# Patient Record
Sex: Female | Born: 1990 | Race: Black or African American | Hispanic: No | Smoking: Former smoker
Health system: Southern US, Community
[De-identification: ages and names within clinical notes are randomized; demographics above are authoritative.]

## PROBLEM LIST (undated history)

## (undated) ENCOUNTER — Inpatient Hospital Stay (HOSPITAL_COMMUNITY): Payer: Self-pay

## (undated) ENCOUNTER — Emergency Department (HOSPITAL_COMMUNITY): Payer: Medicaid Other | Source: Home / Self Care

## (undated) ENCOUNTER — Inpatient Hospital Stay (HOSPITAL_COMMUNITY): Admission: AD | Payer: Medicaid Other

## (undated) DIAGNOSIS — F32A Depression, unspecified: Secondary | ICD-10-CM

## (undated) DIAGNOSIS — A609 Anogenital herpesviral infection, unspecified: Secondary | ICD-10-CM

## (undated) DIAGNOSIS — F329 Major depressive disorder, single episode, unspecified: Secondary | ICD-10-CM

## (undated) DIAGNOSIS — R51 Headache: Secondary | ICD-10-CM

## (undated) DIAGNOSIS — F419 Anxiety disorder, unspecified: Secondary | ICD-10-CM

## (undated) DIAGNOSIS — R87629 Unspecified abnormal cytological findings in specimens from vagina: Secondary | ICD-10-CM

## (undated) DIAGNOSIS — R519 Headache, unspecified: Secondary | ICD-10-CM

## (undated) DIAGNOSIS — R011 Cardiac murmur, unspecified: Secondary | ICD-10-CM

## (undated) DIAGNOSIS — N390 Urinary tract infection, site not specified: Secondary | ICD-10-CM

## (undated) DIAGNOSIS — R002 Palpitations: Secondary | ICD-10-CM

## (undated) DIAGNOSIS — A630 Anogenital (venereal) warts: Secondary | ICD-10-CM

## (undated) HISTORY — DX: Anogenital (venereal) warts: A63.0

## (undated) HISTORY — DX: Anogenital herpesviral infection, unspecified: A60.9

## (undated) HISTORY — PX: WISDOM TOOTH EXTRACTION: SHX21

## (undated) HISTORY — PX: LEEP: SHX91

## (undated) HISTORY — PX: OTHER SURGICAL HISTORY: SHX169

---
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
## 2011-01-12 ENCOUNTER — Ambulatory Visit (HOSPITAL_COMMUNITY)
Admission: RE | Admit: 2011-01-12 | Discharge: 2011-01-12 | Disposition: A | Discharge status: Home / Self Care | Payer: Medicaid Other | Source: Ambulatory Visit | Attending: Obstetrics & Gynecology | Admitting: Obstetrics & Gynecology

## 2011-01-12 ENCOUNTER — Ambulatory Visit (HOSPITAL_COMMUNITY): Payer: Medicaid Other

## 2011-01-12 DIAGNOSIS — O350XX0 Maternal care for (suspected) central nervous system malformation in fetus, not applicable or unspecified: Secondary | ICD-10-CM | POA: Insufficient documentation

## 2011-01-12 DIAGNOSIS — O3500X0 Maternal care for (suspected) central nervous system malformation or damage in fetus, unspecified, not applicable or unspecified: Secondary | ICD-10-CM | POA: Insufficient documentation

## 2011-01-12 DIAGNOSIS — O0990 Supervision of high risk pregnancy, unspecified, unspecified trimester: Secondary | ICD-10-CM

## 2011-01-13 ENCOUNTER — Other Ambulatory Visit: Payer: Self-pay | Admitting: Obstetrics & Gynecology

## 2011-01-13 DIAGNOSIS — G930 Cerebral cysts: Secondary | ICD-10-CM

## 2011-01-13 DIAGNOSIS — O2690 Pregnancy related conditions, unspecified, unspecified trimester: Secondary | ICD-10-CM

## 2011-01-14 ENCOUNTER — Ambulatory Visit (HOSPITAL_COMMUNITY)
Admission: RE | Admit: 2011-01-14 | Discharge: 2011-01-14 | Disposition: A | Discharge status: Home / Self Care | Payer: Medicaid Other | Source: Ambulatory Visit | Attending: Obstetrics & Gynecology | Admitting: Obstetrics & Gynecology

## 2011-01-14 DIAGNOSIS — G930 Cerebral cysts: Secondary | ICD-10-CM

## 2011-01-14 DIAGNOSIS — O2690 Pregnancy related conditions, unspecified, unspecified trimester: Secondary | ICD-10-CM

## 2011-01-14 DIAGNOSIS — O350XX0 Maternal care for (suspected) central nervous system malformation in fetus, not applicable or unspecified: Secondary | ICD-10-CM | POA: Insufficient documentation

## 2011-01-14 DIAGNOSIS — O3500X0 Maternal care for (suspected) central nervous system malformation or damage in fetus, unspecified, not applicable or unspecified: Secondary | ICD-10-CM | POA: Insufficient documentation

## 2011-01-14 DIAGNOSIS — O358XX0 Maternal care for other (suspected) fetal abnormality and damage, not applicable or unspecified: Secondary | ICD-10-CM | POA: Insufficient documentation

## 2011-01-14 NOTE — Progress Notes (Signed)
Patient seen for ultrasound only appointment today.  Please see [facility] report for details.  

## 2011-01-15 ENCOUNTER — Encounter (HOSPITAL_COMMUNITY): Payer: Self-pay

## 2011-01-29 ENCOUNTER — Encounter: Payer: Self-pay | Admitting: Pediatrics

## 2011-02-03 ENCOUNTER — Inpatient Hospital Stay (HOSPITAL_COMMUNITY)
Admission: AD | Admit: 2011-02-03 | Discharge: 2011-02-03 | Disposition: A | Discharge status: Home / Self Care | Payer: Medicaid Other | Source: Ambulatory Visit | Attending: Obstetrics | Admitting: Obstetrics

## 2011-02-03 ENCOUNTER — Encounter (HOSPITAL_COMMUNITY): Payer: Self-pay | Admitting: *Deleted

## 2011-02-03 DIAGNOSIS — O99891 Other specified diseases and conditions complicating pregnancy: Secondary | ICD-10-CM | POA: Insufficient documentation

## 2011-02-03 DIAGNOSIS — N949 Unspecified condition associated with female genital organs and menstrual cycle: Secondary | ICD-10-CM | POA: Insufficient documentation

## 2011-02-03 HISTORY — DX: Palpitations: R00.2

## 2011-02-03 LAB — URINE MICROSCOPIC-ADD ON

## 2011-02-03 LAB — URINALYSIS, ROUTINE W REFLEX MICROSCOPIC
Glucose, UA: NEGATIVE mg/dL
Hgb urine dipstick: NEGATIVE
Ketones, ur: NEGATIVE mg/dL
Urobilinogen, UA: 0.2 mg/dL (ref 0.0–1.0)
pH: 6 (ref 5.0–8.0)

## 2011-02-03 NOTE — Progress Notes (Signed)
Pt in c/o pelvic pain, worse with movement on left side.  States pains are sharp and shooting.  Reports occasional sharp pains that radiate down left leg.  Denies any bleeding, discharge, or leaking of fluid.  + FM.

## 2011-03-11 ENCOUNTER — Encounter (HOSPITAL_COMMUNITY): Payer: Self-pay | Admitting: *Deleted

## 2011-03-11 ENCOUNTER — Inpatient Hospital Stay (HOSPITAL_COMMUNITY)
Admission: AD | Admit: 2011-03-11 | Discharge: 2011-03-11 | Disposition: A | Discharge status: Home / Self Care | Payer: Medicaid Other | Source: Ambulatory Visit | Attending: Obstetrics & Gynecology | Admitting: Obstetrics & Gynecology

## 2011-03-11 DIAGNOSIS — O4700 False labor before 37 completed weeks of gestation, unspecified trimester: Secondary | ICD-10-CM | POA: Insufficient documentation

## 2011-03-11 LAB — URINALYSIS, ROUTINE W REFLEX MICROSCOPIC
Hgb urine dipstick: NEGATIVE
Ketones, ur: NEGATIVE mg/dL
Protein, ur: NEGATIVE mg/dL
Urobilinogen, UA: 0.2 mg/dL (ref 0.0–1.0)

## 2011-03-11 LAB — URINE MICROSCOPIC-ADD ON

## 2011-03-11 NOTE — ED Provider Notes (Signed)
History     Chief Complaint  Patient presents with  . Abdominal Pain   HPI Krista Meza 20 y.o.  35w 6d gestation.  Had difficulty sleeping last night.  Is feeling some pressure like having a bowel movement.  Had one BM today.  Also having upper midline abdominal pain.  Has periodic pain which comes and goes.   OB History    Grav Para Term Preterm Abortions TAB SAB Ect Mult Living   1               Past Medical History  Diagnosis Date  . Heart palpitations     Past Surgical History  Procedure Date  . No past surgeries     No family history on file.  History  Substance Use Topics  . Smoking status: Never Smoker   . Smokeless tobacco: Never Used  . Alcohol Use: No    Allergies:  Allergies  Allergen Reactions  . Amoxicillin Hives    Prescriptions prior to admission  Medication Sig Dispense Refill  . acetaminophen (TYLENOL) 500 MG tablet Take 1,000 mg by mouth every 6 (six) hours as needed. Patient takes for pain       . prenatal vitamin w/FE, FA (PRENATAL 1 + 1) 27-1 MG TABS Take 1 tablet by mouth daily.          Review of Systems  Gastrointestinal: Positive for abdominal pain.  Genitourinary:       Pressure like needing to have a BM   Physical Exam   Blood pressure 131/76, pulse 105, temperature 98.5 F (36.9 C), temperature source Oral, resp. rate 20, height 5\' 1"  (1.549 m), weight 190 lb 12.8 oz (86.546 kg), SpO2 99.00%.  Physical Exam  Nursing note and vitals reviewed. Constitutional: She is oriented to person, place, and time. She appears well-developed and well-nourished.  HENT:  Head: Normocephalic.  Eyes: EOM are normal.  Neck: Neck supple.  GI: Soft. There is no tenderness. There is no rebound and no guarding.       One contraction only on monitor strip. FHT reactive. Baby moving well.  Genitourinary:       Bimanual exam: Cervix 1cm,. 50%, VTX -2 Uterus gravid Adnexa non tender, no masses bilaterally Chaperone present for exam.    Musculoskeletal: Normal range of motion.  Neurological: She is alert and oriented to person, place, and time.  Skin: Skin is warm and dry.  Psychiatric: She has a normal mood and affect.  Client grimaces with fetal movement.  MAU Course  Procedures  MDM Results for orders placed during the hospital encounter of 03/11/11 (from the past 24 hour(s))  URINALYSIS, ROUTINE W REFLEX MICROSCOPIC     Status: Abnormal   Collection Time   03/11/11 10:30 AM      Component Value Range   Color, Urine YELLOW  YELLOW    Appearance CLEAR  CLEAR    Specific Gravity, Urine 1.020  1.005 - 1.030    pH 6.5  5.0 - 8.0    Glucose, UA NEGATIVE  NEGATIVE (mg/dL)   Hgb urine dipstick NEGATIVE  NEGATIVE    Bilirubin Urine NEGATIVE  NEGATIVE    Ketones, ur NEGATIVE  NEGATIVE (mg/dL)   Protein, ur NEGATIVE  NEGATIVE (mg/dL)   Urobilinogen, UA 0.2  0.0 - 1.0 (mg/dL)   Nitrite NEGATIVE  NEGATIVE    Leukocytes, UA SMALL (*) NEGATIVE   URINE MICROSCOPIC-ADD ON     Status: Abnormal   Collection Time   03/11/11  10:30 AM      Component Value Range   Squamous Epithelial / LPF MANY (*) RARE    WBC, UA 3-6  <3 (WBC/hpf)     Assessment and Plan  No UTI False labor  Plan: Keep your appointment in the office tomorrow. Call your doctor if your pain worsens or if you have questions. OK to continue to go to school.  Kindal Ponti 03/11/2011, 12:21 PM   Nolene Bernheim, NP 03/11/11 1307  Nolene Bernheim, NP 03/11/11 1308

## 2011-03-11 NOTE — Progress Notes (Signed)
C/O vague pains in abd. States has had some difficulty having BM. Did have small bowel movement this AM.

## 2011-03-11 NOTE — Progress Notes (Signed)
Pt states that last night she started having tightening around the umbilical area with the need to have a BM. Was unable to sleep and the tightening continues this am. No bleeding or leaking and the baby has been moving well per patient.

## 2011-03-26 ENCOUNTER — Inpatient Hospital Stay (HOSPITAL_COMMUNITY)
Admission: AD | Admit: 2011-03-26 | Discharge: 2011-03-26 | Disposition: A | Discharge status: Home / Self Care | Payer: Medicaid Other | Source: Ambulatory Visit | Attending: Obstetrics & Gynecology | Admitting: Obstetrics & Gynecology

## 2011-03-26 ENCOUNTER — Encounter (HOSPITAL_COMMUNITY): Payer: Self-pay | Admitting: *Deleted

## 2011-03-26 DIAGNOSIS — O10919 Unspecified pre-existing hypertension complicating pregnancy, unspecified trimester: Secondary | ICD-10-CM

## 2011-03-26 DIAGNOSIS — O99891 Other specified diseases and conditions complicating pregnancy: Secondary | ICD-10-CM | POA: Insufficient documentation

## 2011-03-26 DIAGNOSIS — R030 Elevated blood-pressure reading, without diagnosis of hypertension: Secondary | ICD-10-CM | POA: Insufficient documentation

## 2011-03-26 DIAGNOSIS — R51 Headache: Secondary | ICD-10-CM | POA: Insufficient documentation

## 2011-03-26 HISTORY — DX: Urinary tract infection, site not specified: N39.0

## 2011-03-26 HISTORY — DX: Cardiac murmur, unspecified: R01.1

## 2011-03-26 LAB — COMPREHENSIVE METABOLIC PANEL
BUN: 5 mg/dL — ABNORMAL LOW (ref 6–23)
CO2: 24 mEq/L (ref 19–32)
Calcium: 9.7 mg/dL (ref 8.4–10.5)
Creatinine, Ser: 0.65 mg/dL (ref 0.50–1.10)
GFR calc Af Amer: >90 mL/min (ref >90)
GFR calc non Af Amer: >90 mL/min (ref >90)
Glucose, Bld: 88 mg/dL (ref 70–99)
Total Protein: 6.8 g/dL (ref 6.0–8.3)

## 2011-03-26 LAB — CBC
HCT: 33.9 % — ABNORMAL LOW (ref 36.0–46.0)
Hemoglobin: 10.9 g/dL — ABNORMAL LOW (ref 12.0–15.0)
MCH: 28.8 pg (ref 26.0–34.0)
MCHC: 32.2 g/dL (ref 30.0–36.0)
MCV: 89.7 fL (ref 78.0–100.0)
RBC: 3.78 MIL/uL — ABNORMAL LOW (ref 3.87–5.11)

## 2011-03-26 LAB — URINALYSIS, ROUTINE W REFLEX MICROSCOPIC
Glucose, UA: NEGATIVE mg/dL
Ketones, ur: NEGATIVE mg/dL
Protein, ur: NEGATIVE mg/dL
Urobilinogen, UA: 0.2 mg/dL (ref 0.0–1.0)

## 2011-03-26 LAB — URINE MICROSCOPIC-ADD ON

## 2011-03-26 LAB — URIC ACID: Uric Acid, Serum: 3.7 mg/dL (ref 2.4–7.0)

## 2011-03-26 MED ORDER — ACETAMINOPHEN 500 MG PO TABS
1000.0000 mg | ORAL_TABLET | Freq: Once | ORAL | Status: AC
  Administered 2011-03-26: 1000 mg via ORAL
  Filled 2011-03-26: qty 2

## 2011-03-26 NOTE — ED Provider Notes (Signed)
History   Pt presents today for preeclamptic workup. She was seen earlier in the office by Dr. Tamela Oddi with slightly elevated BP and HA. She states she also has feelings of "hot flashes" and occ blurry vision. She denies RUQ pain and reports GFM.  Chief Complaint  Patient presents with  . Hypertension   HPI  OB History    Grav Para Term Preterm Abortions TAB SAB Ect Mult Living   1               Past Medical History  Diagnosis Date  . Heart palpitations   . Heart murmur     palpitations  . Asthma     as child, inhaler for a year  . Urinary tract infection     Past Surgical History  Procedure Date  . No past surgeries     No family history on file.  History  Substance Use Topics  . Smoking status: Never Smoker   . Smokeless tobacco: Never Used  . Alcohol Use: No    Allergies:  Allergies  Allergen Reactions  . Amoxicillin Hives  . Penicillins Hives    Pt is unsure if she is allergic to penicillin or amoxicillin    Prescriptions prior to admission  Medication Sig Dispense Refill  . acetaminophen (TYLENOL) 500 MG tablet Take 1,000 mg by mouth every 6 (six) hours as needed. Patient takes for pain       . prenatal vitamin w/FE, FA (PRENATAL 1 + 1) 27-1 MG TABS Take 1 tablet by mouth daily.          Review of Systems  Constitutional: Negative for fever.  Eyes: Positive for blurred vision. Negative for double vision, photophobia, pain and discharge.  Cardiovascular: Negative for chest pain and palpitations.  Gastrointestinal: Negative for nausea, vomiting, abdominal pain, diarrhea and constipation.  Genitourinary: Negative for dysuria, urgency, frequency and hematuria.  Neurological: Positive for dizziness and headaches.  Psychiatric/Behavioral: Negative for depression and suicidal ideas.   Physical Exam   Blood pressure 106/74, pulse 72, temperature 98.7 F (37.1 C), temperature source Oral, resp. rate 20, height 5\' 1"  (1.549 m), weight 193 lb 3.2 oz  (87.635 kg), SpO2 98.00%.  Physical Exam  Nursing note and vitals reviewed. Constitutional: She is oriented to person, place, and time. She appears well-developed and well-nourished. No distress.  HENT:  Head: Normocephalic and atraumatic.  Eyes: EOM are normal. Pupils are equal, round, and reactive to light.  GI: Soft. She exhibits no distension. There is no tenderness. There is no rebound and no guarding.  Neurological: She is alert and oriented to person, place, and time.  Skin: Skin is warm and dry. She is not diaphoretic.  Psychiatric: She has a normal mood and affect. Her behavior is normal. Judgment and thought content normal.    MAU Course  Procedures  Results for orders placed during the hospital encounter of 03/26/11 (from the past 24 hour(s))  CBC     Status: Abnormal   Collection Time   03/26/11  3:48 PM      Component Value Range   WBC 7.9  4.0 - 10.5 (K/uL)   RBC 3.78 (*) 3.87 - 5.11 (MIL/uL)   Hemoglobin 10.9 (*) 12.0 - 15.0 (g/dL)   HCT 16.1 (*) 09.6 - 46.0 (%)   MCV 89.7  78.0 - 100.0 (fL)   MCH 28.8  26.0 - 34.0 (pg)   MCHC 32.2  30.0 - 36.0 (g/dL)   RDW 04.5  40.9 -  15.5 (%)   Platelets 250  150 - 400 (K/uL)  COMPREHENSIVE METABOLIC PANEL     Status: Abnormal   Collection Time   03/26/11  3:48 PM      Component Value Range   Sodium 133 (*) 135 - 145 (mEq/L)   Potassium 4.3  3.5 - 5.1 (mEq/L)   Chloride 102  96 - 112 (mEq/L)   CO2 24  19 - 32 (mEq/L)   Glucose, Bld 88  70 - 99 (mg/dL)   BUN 5 (*) 6 - 23 (mg/dL)   Creatinine, Ser 4.09  0.50 - 1.10 (mg/dL)   Calcium 9.7  8.4 - 81.1 (mg/dL)   Total Protein 6.8  6.0 - 8.3 (g/dL)   Albumin 3.0 (*) 3.5 - 5.2 (g/dL)   AST 21  0 - 37 (U/L)   ALT 12  0 - 35 (U/L)   Alkaline Phosphatase 143 (*) 39 - 117 (U/L)   Total Bilirubin 0.2 (*) 0.3 - 1.2 (mg/dL)   GFR calc non Af Amer >90  >90 (mL/min)   GFR calc Af Amer >90  >90 (mL/min)  URIC ACID     Status: Normal   Collection Time   03/26/11  3:48 PM       Component Value Range   Uric Acid, Serum 3.7  2.4 - 7.0 (mg/dL)  LACTATE DEHYDROGENASE     Status: Normal   Collection Time   03/26/11  3:48 PM      Component Value Range   LD 172  94 - 250 (U/L)   NST reactive.  Discussed pt with Dr. Tamela Oddi. Ok to dc to home. Assessment and Plan  Preg: no evidence of preeclampsia at this time. BP NL. Will dc to home. She has f/u scheduled with Dr. Tamela Oddi on Monday. Discussed diet, activity, risks, and precautions.  Clinton Gallant. Rice III, DrHSc, MPAS, PA-C  03/26/2011, 4:22 PM   Henrietta Hoover, PA 03/26/11 1656

## 2011-03-26 NOTE — Progress Notes (Signed)
Patient states she has been having nausea, dizziness, headaches and blurred vision and hot flashes. Blood pressure was slightly elevated in the office and was sent to MAU for evaluation. Reports good fetal movement, no bleeding or leaking.

## 2011-03-26 NOTE — Progress Notes (Signed)
Ongoing headache, has not taken anything.  Eyes are hurting.  BP up today (reg appt)

## 2011-04-13 ENCOUNTER — Inpatient Hospital Stay (HOSPITAL_COMMUNITY): Payer: Medicaid Other

## 2011-04-13 ENCOUNTER — Encounter (HOSPITAL_COMMUNITY): Payer: Self-pay | Admitting: *Deleted

## 2011-04-13 ENCOUNTER — Inpatient Hospital Stay (HOSPITAL_COMMUNITY)
Admission: AD | Admit: 2011-04-13 | Discharge: 2011-04-13 | Disposition: A | Discharge status: Home / Self Care | Payer: Medicaid Other | Source: Ambulatory Visit | Attending: Obstetrics & Gynecology | Admitting: Obstetrics & Gynecology

## 2011-04-13 DIAGNOSIS — R109 Unspecified abdominal pain: Secondary | ICD-10-CM | POA: Insufficient documentation

## 2011-04-13 DIAGNOSIS — O368190 Decreased fetal movements, unspecified trimester, not applicable or unspecified: Secondary | ICD-10-CM | POA: Insufficient documentation

## 2011-04-13 LAB — POCT FERN TEST: Fern Test: NEGATIVE

## 2011-04-13 NOTE — Progress Notes (Signed)
Patient states she has felt decrease in fetal movement for about 6 days. Still feels movement, not as much. Has passed her mucus plug and having abdominal and low back pain. Had membranes stripped last week and was 4 cm.

## 2011-04-13 NOTE — Progress Notes (Signed)
Has been leaking clear fluid since Sunday, no active leaking.

## 2011-04-13 NOTE — ED Provider Notes (Signed)
History     CSN: [id number] Arrival date & time: 04/13/2011 11:48 AM   None     Chief Complaint  Patient presents with  . Decreased Fetal Movement  . Abdominal Pain  . Back Pain    HPI Krista Meza is a 20 y.o. female @ [redacted]w[redacted]d who presents to MAU for ? Rupture of membranes. Last office visit 4 cm dilated. Scheduled for induction 04/16/11.   Past Medical History  Diagnosis Date  . Heart palpitations   . Heart murmur     palpitations  . Asthma     as child, inhaler for a year  . Urinary tract infection     Past Surgical History  Procedure Date  . No past surgeries     Family History  Problem Relation Age of Onset  . Anesthesia problems Neg Hx     History  Substance Use Topics  . Smoking status: Never Smoker   . Smokeless tobacco: Never Used  . Alcohol Use: No    OB History    Grav Para Term Preterm Abortions TAB SAB Ect Mult Living   1               Review of Systems  Allergies  Amoxicillin and Penicillins  Home Medications  No current outpatient prescriptions on file.  BP 114/76  Pulse 84  Temp(Src) 98.4 F (36.9 C) (Oral)  Resp 20  Ht 5\' 1"  (1.549 m)  Wt 193 lb 6.4 oz (87.726 kg)  BMI 36.54 kg/m2  SpO2 98%  Physical Exam  Nursing note and vitals reviewed. Constitutional: She is oriented to person, place, and time. She appears well-developed and well-nourished.  HENT:  Head: Normocephalic and atraumatic.  Eyes: EOM are normal.  Neck: Neck supple.  Cardiovascular: Normal rate.   Pulmonary/Chest: Effort normal.  Abdominal: Soft. There is no tenderness.       Gravid c/w dates.  Genitourinary:       External genitalia without lesions. Small amount of white discharge vaginal vault. No pooling noted. Cervix 4 cm. Uterus size consistent with dates.  Musculoskeletal: Normal range of motion.  Neurological: She is alert and oriented to person, place, and time. No cranial nerve deficit.  Skin: Skin is warm and dry.  Psychiatric: She has a  normal mood and affect. Her behavior is normal. Judgment and thought content normal.     Slide collected and examined for ferning. Is fern negative.  Orders from Dr. Marcia Brash office for ultrasound. RN to call results to office.  ED Course  Procedures  MDM          Kerrie Buffalo, NP 04/13/11 1335

## 2011-04-13 NOTE — Progress Notes (Signed)
Pt in c/o leaking of fluid since Sunday, states she lost her mucus plug on Friday and Saturday.  Reports headaches since last Tuesday, has been taking tylenol, does not help.  Reports piercing pains down legs, mainly right, and in lower back.  Denies any bleeding.  Reports decreased fetal movement since Saturday.

## 2011-04-15 ENCOUNTER — Encounter (HOSPITAL_COMMUNITY): Payer: Self-pay | Admitting: Anesthesiology

## 2011-04-15 ENCOUNTER — Inpatient Hospital Stay (HOSPITAL_COMMUNITY)
Admission: AD | Admit: 2011-04-15 | Discharge: 2011-04-19 | DRG: 766 | Disposition: A | Discharge status: Home / Self Care | Payer: Medicaid Other | Source: Ambulatory Visit | Attending: Obstetrics & Gynecology | Admitting: Obstetrics & Gynecology

## 2011-04-15 ENCOUNTER — Other Ambulatory Visit: Payer: Self-pay | Admitting: Obstetrics & Gynecology

## 2011-04-15 ENCOUNTER — Other Ambulatory Visit: Payer: Self-pay | Admitting: Obstetrics and Gynecology

## 2011-04-15 ENCOUNTER — Telehealth (HOSPITAL_COMMUNITY): Payer: Self-pay | Admitting: *Deleted

## 2011-04-15 ENCOUNTER — Inpatient Hospital Stay (HOSPITAL_COMMUNITY): Admission: RE | Admit: 2011-04-15 | Payer: Medicaid Other | Source: Ambulatory Visit

## 2011-04-15 ENCOUNTER — Encounter (HOSPITAL_COMMUNITY): Payer: Self-pay | Admitting: *Deleted

## 2011-04-15 ENCOUNTER — Encounter (HOSPITAL_COMMUNITY): Payer: Self-pay

## 2011-04-15 DIAGNOSIS — O324XX0 Maternal care for high head at term, not applicable or unspecified: Secondary | ICD-10-CM | POA: Diagnosis present

## 2011-04-15 DIAGNOSIS — O480 Post-term pregnancy: Principal | ICD-10-CM | POA: Diagnosis present

## 2011-04-15 LAB — CBC
MCH: 29.7 pg (ref 26.0–34.0)
MCHC: 33.1 g/dL (ref 30.0–36.0)
MCV: 89.6 fL (ref 78.0–100.0)
Platelets: 245 10*3/uL (ref 150–400)
RBC: 3.94 MIL/uL (ref 3.87–5.11)
RDW: 14.5 % (ref 11.5–15.5)

## 2011-04-15 MED ORDER — EPHEDRINE 5 MG/ML INJ
10.0000 mg | INTRAVENOUS | Status: DC | PRN
  Filled 2011-04-15: qty 4

## 2011-04-15 MED ORDER — BUTORPHANOL TARTRATE 2 MG/ML IJ SOLN
1.0000 mg | INTRAMUSCULAR | Status: DC | PRN
  Administered 2011-04-15 (×2): 1 mg via INTRAVENOUS
  Filled 2011-04-15 (×2): qty 1

## 2011-04-15 MED ORDER — OXYTOCIN 20 UNITS IN LACTATED RINGERS INFUSION - SIMPLE: 125.0000 mL/h | Freq: Once | INTRAVENOUS | Status: DC

## 2011-04-15 MED ORDER — FENTANYL 2.5 MCG/ML BUPIVACAINE 1/10 % EPIDURAL INFUSION (WH - ANES)
14.0000 mL/h | INTRAMUSCULAR | Status: DC
  Administered 2011-04-16 (×3): 14 mL/h via EPIDURAL
  Filled 2011-04-15 (×4): qty 60

## 2011-04-15 MED ORDER — EPHEDRINE 5 MG/ML INJ: 10.0000 mg | INTRAVENOUS | Status: DC | PRN

## 2011-04-15 MED ORDER — LACTATED RINGERS IV SOLN
500.0000 mL | Freq: Once | INTRAVENOUS | Status: AC
  Administered 2011-04-16: 500 mL via INTRAVENOUS

## 2011-04-15 MED ORDER — ONDANSETRON HCL 4 MG/2ML IJ SOLN: 4.0000 mg | Freq: Four times a day (QID) | INTRAMUSCULAR | Status: DC | PRN

## 2011-04-15 MED ORDER — LACTATED RINGERS IV SOLN
INTRAVENOUS | Status: DC
  Administered 2011-04-15 – 2011-04-16 (×2): 125 mL/h via INTRAVENOUS

## 2011-04-15 MED ORDER — PHENYLEPHRINE 40 MCG/ML (10ML) SYRINGE FOR IV PUSH (FOR BLOOD PRESSURE SUPPORT): 80.0000 ug | PREFILLED_SYRINGE | INTRAVENOUS | Status: DC | PRN

## 2011-04-15 MED ORDER — IBUPROFEN 600 MG PO TABS: 600.0000 mg | ORAL_TABLET | Freq: Four times a day (QID) | ORAL | Status: DC | PRN

## 2011-04-15 MED ORDER — OXYCODONE-ACETAMINOPHEN 5-325 MG PO TABS: 2.0000 | ORAL_TABLET | ORAL | Status: DC | PRN

## 2011-04-15 MED ORDER — PHENYLEPHRINE 40 MCG/ML (10ML) SYRINGE FOR IV PUSH (FOR BLOOD PRESSURE SUPPORT)
80.0000 ug | PREFILLED_SYRINGE | INTRAVENOUS | Status: DC | PRN
  Filled 2011-04-15: qty 5

## 2011-04-15 MED ORDER — FENTANYL 2.5 MCG/ML BUPIVACAINE 1/10 % EPIDURAL INFUSION (WH - ANES)
INTRAMUSCULAR | Status: DC | PRN
  Administered 2011-04-15: 14 mL/h via EPIDURAL

## 2011-04-15 MED ORDER — LIDOCAINE HCL (PF) 1 % IJ SOLN
30.0000 mL | INTRAMUSCULAR | Status: DC | PRN
  Filled 2011-04-15: qty 30

## 2011-04-15 MED ORDER — DIPHENHYDRAMINE HCL 50 MG/ML IJ SOLN: 12.5000 mg | INTRAMUSCULAR | Status: DC | PRN

## 2011-04-15 MED ORDER — LACTATED RINGERS IV SOLN
500.0000 mL | INTRAVENOUS | Status: DC | PRN
  Administered 2011-04-15 – 2011-04-16 (×2): 1000 mL via INTRAVENOUS

## 2011-04-15 MED ORDER — CITRIC ACID-SODIUM CITRATE 334-500 MG/5ML PO SOLN
30.0000 mL | ORAL | Status: DC | PRN
  Administered 2011-04-16: 30 mL via ORAL
  Filled 2011-04-15: qty 15

## 2011-04-15 MED ORDER — ACETAMINOPHEN 325 MG PO TABS: 650.0000 mg | ORAL_TABLET | ORAL | Status: DC | PRN

## 2011-04-15 MED ORDER — OXYTOCIN BOLUS FROM INFUSION
500.0000 mL | Freq: Once | INTRAVENOUS | Status: DC
  Filled 2011-04-15: qty 1000
  Filled 2011-04-15: qty 500

## 2011-04-15 MED ORDER — LIDOCAINE HCL 1.5 % IJ SOLN
INTRAMUSCULAR | Status: DC | PRN
  Administered 2011-04-15 (×2): 5 mL via EPIDURAL

## 2011-04-15 NOTE — Anesthesia Preprocedure Evaluation (Signed)
Anesthesia Evaluation  Patient identified by MRN, date of birth, ID band Patient awake    Reviewed: Allergy & Precautions, H&P , NPO status , Patient's Chart, lab work & pertinent test results  Airway Mallampati: II TM Distance: >3 FB Neck ROM: full    Dental No notable dental hx.    Pulmonary    Pulmonary exam normal       Cardiovascular neg cardio ROS     Neuro/Psych Negative Neurological ROS  Negative Psych ROS   GI/Hepatic negative GI ROS, Neg liver ROS,   Endo/Other  Morbid obesity  Renal/GU negative Renal ROS  Genitourinary negative   Musculoskeletal negative musculoskeletal ROS (+)   Abdominal (+) obese,   Peds negative pediatric ROS (+)  Hematology negative hematology ROS (+)   Anesthesia Other Findings   Reproductive/Obstetrics (+) Pregnancy                           Anesthesia Physical Anesthesia Plan  ASA: III  Anesthesia Plan: Epidural   Post-op Pain Management:    Induction:   Airway Management Planned:   Additional Equipment:   Intra-op Plan:   Post-operative Plan:   Informed Consent: I have reviewed the patients History and Physical, chart, labs and discussed the procedure including the risks, benefits and alternatives for the proposed anesthesia with the patient or authorized representative who has indicated his/her understanding and acceptance.     Plan Discussed with:   Anesthesia Plan Comments:         Anesthesia Quick Evaluation

## 2011-04-15 NOTE — Telephone Encounter (Signed)
Preadmission screen  

## 2011-04-15 NOTE — H&P (Signed)
Krista Meza is a 20 y.o. G1P0  female presenting for worsening contractions, IUP at 40.6weeks. She was scheduled for pitocin induction this evening. Maternal Medical History:  Reason for admission: Reason for admission: contractions and nausea.  Worsening since office visit today.  Contractions: Onset was 3-5 hours ago.   Frequency: regular.   Perceived severity is moderate.    Fetal activity: Perceived fetal activity is normal.   Last perceived fetal movement was within the past hour.    Prenatal complications: no prenatal complications Postdates pregnancy.  Prenatal Complications - Diabetes: none.    OB History    Grav Para Term Preterm Abortions TAB SAB Ect Mult Living   1              Past Medical History  Diagnosis Date  . Heart palpitations   . Heart murmur     palpitations  . Asthma     as child, inhaler for a year  . Urinary tract infection    Past Surgical History  Procedure Date  . No past surgeries    Family History: family history is negative for Anesthesia problems, and Hypotension, and Malignant hyperthermia, and Pseudochol deficiency, . Social History:  reports that she has never smoked. She has never used smokeless tobacco. She reports that she does not drink alcohol or use illicit drugs.  Review of Systems  Constitutional: Negative for fever and chills.  Respiratory: Negative for cough and shortness of breath.   Cardiovascular: Negative for chest pain and palpitations.  Gastrointestinal: Positive for nausea. Negative for vomiting.  Genitourinary: Negative.   Musculoskeletal: Negative.   Skin: Negative.   Neurological: Negative for dizziness and headaches.    Dilation: 4 Effacement (%): 90 Station: -2 Exam by:: J berrong Blood pressure 119/70, pulse 90, temperature 97.5 F (36.4 C), temperature source Oral, resp. rate 22, height 5\' 1"  (1.549 m), weight 87.544 kg (193 lb). Maternal Exam:  Uterine Assessment: Contraction strength is  moderate.  Contraction frequency is regular.  UCs q2-61mins  Abdomen: Patient reports no abdominal tenderness. Estimated fetal weight is 8lbs.   Fetal presentation: vertex  Introitus: Normal vulva. Normal vagina.  Pelvis: adequate for delivery.   Cervix: Cervix evaluated by digital exam.     Fetal Exam Fetal Monitor Review: Mode: ultrasound.   Baseline rate: 135.  Variability: moderate (6-25 bpm).   Pattern: accelerations present and no decelerations.    Fetal State Assessment: Category I - tracings are normal.     Physical Exam  Constitutional: She is oriented to person, place, and time. She appears well-developed and well-nourished. No distress.  HENT:  Head: Normocephalic and atraumatic.  Eyes: Pupils are equal, round, and reactive to light.  Neck: Normal range of motion.  Cardiovascular: Normal rate, regular rhythm and normal heart sounds.   No murmur heard. Respiratory: Effort normal and breath sounds normal.  GI: Soft. Bowel sounds are normal.  Genitourinary: Vagina normal and uterus normal.  Musculoskeletal: Normal range of motion.  Neurological: She is alert and oriented to person, place, and time. She has normal reflexes.  Skin: Skin is warm and dry.  Psychiatric: She has a normal mood and affect. Her behavior is normal. Judgment and thought content normal.    Prenatal labs: ABO, Rh: O/Positive/-- (05/15 0000) Antibody: Negative (05/15 0000) Rubella: Immune (05/15 0000) RPR: Nonreactive (05/15 0000)  HBsAg: Negative (05/15 0000)  HIV: Non-reactive (05/15 0000)  GBS: Negative (11/14 0000)   Assessment/Plan: Admit to L&D, IVPM and/or epidural prn pain as  requested by pt. Anticipate NSVD, Consult with Dr. Tamela Oddi prn.    [name] CNM 04/15/2011, 7:24 PM

## 2011-04-15 NOTE — Progress Notes (Addendum)
Krista Meza is a 20 y.o. G1P0 at [redacted]w[redacted]d by ultrasound admitted for active labor  Subjective: Pt rec'd stadol x 1, with good relief. Coping well with labor.   Objective: BP 100/57  Pulse 87  Temp(Src) 97.5 F (36.4 C) (Oral)  Resp 22  Ht 5\' 1"  (1.549 m)  Wt 87.544 kg (193 lb)  BMI 36.47 kg/m2      FHT:  FHR: 140 bpm, variability: moderate,  accelerations:  Present,  decelerations:  Absent UC:   regular, every 2-3 minutes, moderate to palpation. SVE:   Dilation: 5.5 Effacement (%): 100 Station: -2 Exam by:: R. Hamby CNM  Labs: Lab Results  Component Value Date   WBC 12.8* 04/15/2011   HGB 11.7* 04/15/2011   HCT 35.3* 04/15/2011   MCV 89.6 04/15/2011   PLT 245 04/15/2011    Assessment / Plan: Spontaneous labor, progressing normally, Consult with Dr. Tamela Oddi prn.   Labor: Progressing normally Preeclampsia:  no signs or symptoms of toxicity Fetal Wellbeing:  Category I Pain Control:  Stadol given x 1, pt declines further intervention at this time.  I/D:  n/a Anticipated MOD:  NSVD  [name] CNM 04/15/2011, 8:27 PM

## 2011-04-15 NOTE — Anesthesia Procedure Notes (Addendum)
Epidural Patient location during procedure: OB Start time: 04/15/2011 10:25 PM End time: 04/15/2011 10:30 PM Reason for block: procedure for pain  Staffing Anesthesiologist: Sandrea Hughs Performed by: anesthesiologist   Preanesthetic Checklist Completed: patient identified, site marked, surgical consent, pre-op evaluation, timeout performed, IV checked, risks and benefits discussed and monitors and equipment checked  Epidural Patient position: sitting Prep: site prepped and draped and DuraPrep Patient monitoring: continuous pulse ox and blood pressure Approach: midline Injection technique: LOR air  Needle:  Needle type: Tuohy  Needle gauge: 17 G Needle length: 9 cm Needle insertion depth: 5 cm cm Catheter type: closed end flexible Catheter size: 19 Gauge Catheter at skin depth: 10 cm Test dose: negative and 1.5% lidocaine  Assessment Sensory level: T8 Events: blood not aspirated, injection not painful, no injection resistance, negative IV test and no paresthesia

## 2011-04-15 NOTE — Progress Notes (Signed)
Krista Meza is a 20 y.o. G1P0 at [redacted]w[redacted]d by ultrasound admitted for active labor  Subjective: Pt with increasing discomfort, and desires epidural.  Objective: BP 105/75  Pulse 95  Temp(Src) 97.5 F (36.4 C) (Oral)  Resp 22  Ht 5\' 1"  (1.549 m)  Wt 87.544 kg (193 lb)  BMI 36.47 kg/m2      FHT:  FHR: 140 bpm, variability: moderate,  accelerations:  Present,  decelerations:  Absent UC:   regular, every 1-3 minutes SVE:   Dilation: 6.5 Effacement (%): 100 Station: -1 Exam by:: R. Hamby CNM  Labs: Lab Results  Component Value Date   WBC 12.8* 04/15/2011   HGB 11.7* 04/15/2011   HCT 35.3* 04/15/2011   MCV 89.6 04/15/2011   PLT 245 04/15/2011    Assessment / Plan: Spontaneous labor, progressing normally, head well applied, AROM large amount of clear fluid. Consult with Dr. Tamela Oddi prn.  Labor: Progressing normally Preeclampsia:  no signs or symptoms of toxicity Fetal Wellbeing:  Category I Pain Control:  labor support, stadol repeated at 2120, epidural now. I/D:  n/a Anticipated MOD:  NSVD  [name] CNM 04/15/2011, 10:24 PM

## 2011-04-16 ENCOUNTER — Inpatient Hospital Stay (HOSPITAL_COMMUNITY): Payer: Medicaid Other | Admitting: Anesthesiology

## 2011-04-16 ENCOUNTER — Encounter (HOSPITAL_COMMUNITY): Payer: Self-pay | Admitting: Anesthesiology

## 2011-04-16 ENCOUNTER — Encounter (HOSPITAL_COMMUNITY): Payer: Self-pay | Admitting: *Deleted

## 2011-04-16 ENCOUNTER — Encounter (HOSPITAL_COMMUNITY)
Admission: AD | Disposition: A | Discharge status: Home / Self Care | Payer: Self-pay | Source: Ambulatory Visit | Attending: Obstetrics & Gynecology

## 2011-04-16 SURGERY — Surgical Case
Anesthesia: Regional | Site: Uterus | Wound class: Clean Contaminated

## 2011-04-16 MED ORDER — TETANUS-DIPHTH-ACELL PERTUSSIS 5-2.5-18.5 LF-MCG/0.5 IM SUSP: 0.5000 mL | Freq: Once | INTRAMUSCULAR | Status: DC

## 2011-04-16 MED ORDER — SCOPOLAMINE 1 MG/3DAYS TD PT72
MEDICATED_PATCH | TRANSDERMAL | Status: AC
  Administered 2011-04-16: 1.5 mg via TRANSDERMAL
  Filled 2011-04-16: qty 1

## 2011-04-16 MED ORDER — NALOXONE HCL 0.4 MG/ML IJ SOLN: 0.4000 mg | INTRAMUSCULAR | Status: DC | PRN

## 2011-04-16 MED ORDER — MEPERIDINE HCL 25 MG/ML IJ SOLN: 6.2500 mg | INTRAMUSCULAR | Status: DC | PRN

## 2011-04-16 MED ORDER — LIDOCAINE-EPINEPHRINE 2 %-1:100000 IJ SOLN
INTRAMUSCULAR | Status: DC | PRN
  Administered 2011-04-16: 4 mL via INTRADERMAL

## 2011-04-16 MED ORDER — MORPHINE SULFATE (PF) 0.5 MG/ML IJ SOLN
INTRAMUSCULAR | Status: DC | PRN
  Administered 2011-04-16: 4 mg via EPIDURAL

## 2011-04-16 MED ORDER — PHENYLEPHRINE HCL 10 MG/ML IJ SOLN
INTRAMUSCULAR | Status: DC | PRN
  Administered 2011-04-16: 80 ug via INTRAVENOUS

## 2011-04-16 MED ORDER — SODIUM BICARBONATE 8.4 % IV SOLN
INTRAVENOUS | Status: DC | PRN
  Administered 2011-04-16: 2 mL via EPIDURAL

## 2011-04-16 MED ORDER — IBUPROFEN 600 MG PO TABS
600.0000 mg | ORAL_TABLET | Freq: Four times a day (QID) | ORAL | Status: DC | PRN
  Administered 2011-04-17: 600 mg via ORAL

## 2011-04-16 MED ORDER — MIDAZOLAM HCL 5 MG/5ML IJ SOLN
INTRAMUSCULAR | Status: DC | PRN
  Administered 2011-04-16 (×2): 1 mg via INTRAVENOUS

## 2011-04-16 MED ORDER — ONDANSETRON HCL 4 MG/2ML IJ SOLN
INTRAMUSCULAR | Status: DC | PRN
  Administered 2011-04-16: 4 mg via INTRAVENOUS

## 2011-04-16 MED ORDER — PRENATAL PLUS 27-1 MG PO TABS
1.0000 | ORAL_TABLET | Freq: Every day | ORAL | Status: DC
  Administered 2011-04-17 – 2011-04-19 (×3): 1 via ORAL
  Filled 2011-04-16 (×3): qty 1

## 2011-04-16 MED ORDER — IBUPROFEN 600 MG PO TABS
600.0000 mg | ORAL_TABLET | Freq: Four times a day (QID) | ORAL | Status: DC
  Administered 2011-04-17 – 2011-04-19 (×9): 600 mg via ORAL
  Filled 2011-04-16 (×10): qty 1

## 2011-04-16 MED ORDER — KETOROLAC TROMETHAMINE 30 MG/ML IJ SOLN: 30.0000 mg | Freq: Four times a day (QID) | INTRAMUSCULAR | Status: AC | PRN

## 2011-04-16 MED ORDER — SODIUM BICARBONATE 8.4 % IV SOLN
INTRAVENOUS | Status: AC
  Filled 2011-04-16: qty 50

## 2011-04-16 MED ORDER — OXYCODONE-ACETAMINOPHEN 5-325 MG PO TABS: 1.0000 | ORAL_TABLET | ORAL | Status: DC | PRN

## 2011-04-16 MED ORDER — LACTATED RINGERS IV SOLN: INTRAVENOUS | Status: DC

## 2011-04-16 MED ORDER — MEDROXYPROGESTERONE ACETATE 150 MG/ML IM SUSP: 150.0000 mg | INTRAMUSCULAR | Status: DC | PRN

## 2011-04-16 MED ORDER — ZOLPIDEM TARTRATE 5 MG PO TABS: 5.0000 mg | ORAL_TABLET | Freq: Every evening | ORAL | Status: DC | PRN

## 2011-04-16 MED ORDER — MEASLES, MUMPS & RUBELLA VAC ~~LOC~~ INJ
0.5000 mL | INJECTION | Freq: Once | SUBCUTANEOUS | Status: DC
  Filled 2011-04-16: qty 0.5

## 2011-04-16 MED ORDER — ONDANSETRON HCL 4 MG/2ML IJ SOLN: 4.0000 mg | Freq: Three times a day (TID) | INTRAMUSCULAR | Status: DC | PRN

## 2011-04-16 MED ORDER — DIBUCAINE 1 % RE OINT: 1.0000 "application " | TOPICAL_OINTMENT | RECTAL | Status: DC | PRN

## 2011-04-16 MED ORDER — SODIUM CHLORIDE 0.9 % IJ SOLN: 3.0000 mL | INTRAMUSCULAR | Status: DC | PRN

## 2011-04-16 MED ORDER — CEFAZOLIN SODIUM 1-5 GM-% IV SOLN
INTRAVENOUS | Status: AC
  Filled 2011-04-16: qty 100

## 2011-04-16 MED ORDER — KETOROLAC TROMETHAMINE 30 MG/ML IJ SOLN
30.0000 mg | Freq: Four times a day (QID) | INTRAMUSCULAR | Status: AC | PRN
  Administered 2011-04-16: 30 mg via INTRAVENOUS
  Filled 2011-04-16: qty 1

## 2011-04-16 MED ORDER — MIDAZOLAM HCL 2 MG/2ML IJ SOLN
INTRAMUSCULAR | Status: AC
  Filled 2011-04-16: qty 2

## 2011-04-16 MED ORDER — MORPHINE SULFATE (PF) 0.5 MG/ML IJ SOLN
INTRAMUSCULAR | Status: DC | PRN
  Administered 2011-04-16: 1 mg via INTRAVENOUS

## 2011-04-16 MED ORDER — WITCH HAZEL-GLYCERIN EX PADS: 1.0000 "application " | MEDICATED_PAD | CUTANEOUS | Status: DC | PRN

## 2011-04-16 MED ORDER — DIPHENHYDRAMINE HCL 25 MG PO CAPS: 25.0000 mg | ORAL_CAPSULE | Freq: Four times a day (QID) | ORAL | Status: DC | PRN

## 2011-04-16 MED ORDER — HYDROMORPHONE HCL PF 1 MG/ML IJ SOLN
0.2500 mg | INTRAMUSCULAR | Status: DC | PRN
  Administered 2011-04-16: 0.5 mg via INTRAVENOUS

## 2011-04-16 MED ORDER — DIPHENHYDRAMINE HCL 50 MG/ML IJ SOLN: 12.5000 mg | INTRAMUSCULAR | Status: DC | PRN

## 2011-04-16 MED ORDER — HYDROMORPHONE HCL PF 1 MG/ML IJ SOLN
INTRAMUSCULAR | Status: AC
  Filled 2011-04-16: qty 1

## 2011-04-16 MED ORDER — LIDOCAINE-EPINEPHRINE (PF) 2 %-1:200000 IJ SOLN
INTRAMUSCULAR | Status: AC
  Filled 2011-04-16: qty 20

## 2011-04-16 MED ORDER — LACTATED RINGERS IV SOLN
INTRAVENOUS | Status: DC | PRN
  Administered 2011-04-16: 10:00:00 via INTRAVENOUS

## 2011-04-16 MED ORDER — OXYTOCIN 20 UNITS IN LACTATED RINGERS INFUSION - SIMPLE: 125.0000 mL/h | INTRAVENOUS | Status: AC

## 2011-04-16 MED ORDER — BUPIVACAINE HCL (PF) 0.25 % IJ SOLN
INTRAMUSCULAR | Status: DC | PRN
  Administered 2011-04-16 (×2): 5 mL
  Administered 2011-04-16: 4 mL

## 2011-04-16 MED ORDER — MEPERIDINE HCL 25 MG/ML IJ SOLN
INTRAMUSCULAR | Status: DC | PRN
  Administered 2011-04-16 (×3): 6 mg via INTRAVENOUS

## 2011-04-16 MED ORDER — FERROUS SULFATE 325 (65 FE) MG PO TABS
325.0000 mg | ORAL_TABLET | Freq: Two times a day (BID) | ORAL | Status: DC
  Administered 2011-04-17 – 2011-04-19 (×5): 325 mg via ORAL
  Filled 2011-04-16 (×5): qty 1

## 2011-04-16 MED ORDER — ONDANSETRON HCL 4 MG PO TABS: 4.0000 mg | ORAL_TABLET | ORAL | Status: DC | PRN

## 2011-04-16 MED ORDER — SODIUM CHLORIDE 0.9 % IV SOLN
1.0000 ug/kg/h | INTRAVENOUS | Status: DC | PRN
  Filled 2011-04-16: qty 2.5

## 2011-04-16 MED ORDER — METOCLOPRAMIDE HCL 5 MG/ML IJ SOLN: 10.0000 mg | Freq: Three times a day (TID) | INTRAMUSCULAR | Status: DC | PRN

## 2011-04-16 MED ORDER — FENTANYL CITRATE 0.05 MG/ML IJ SOLN
INTRAMUSCULAR | Status: DC | PRN
  Administered 2011-04-16 (×2): 50 ug via INTRAVENOUS

## 2011-04-16 MED ORDER — MAGNESIUM HYDROXIDE 400 MG/5ML PO SUSP: 30.0000 mL | ORAL | Status: DC | PRN

## 2011-04-16 MED ORDER — MORPHINE SULFATE 0.5 MG/ML IJ SOLN
INTRAMUSCULAR | Status: AC
  Filled 2011-04-16: qty 10

## 2011-04-16 MED ORDER — ONDANSETRON HCL 4 MG/2ML IJ SOLN
INTRAMUSCULAR | Status: AC
  Filled 2011-04-16: qty 2

## 2011-04-16 MED ORDER — DIPHENHYDRAMINE HCL 25 MG PO CAPS
25.0000 mg | ORAL_CAPSULE | ORAL | Status: DC | PRN
  Administered 2011-04-16: 25 mg via ORAL
  Filled 2011-04-16: qty 1

## 2011-04-16 MED ORDER — KETOROLAC TROMETHAMINE 60 MG/2ML IM SOLN
60.0000 mg | Freq: Once | INTRAMUSCULAR | Status: AC | PRN
  Administered 2011-04-16: 60 mg via INTRAMUSCULAR

## 2011-04-16 MED ORDER — OXYTOCIN 10 UNIT/ML IJ SOLN
INTRAMUSCULAR | Status: AC
  Filled 2011-04-16: qty 2

## 2011-04-16 MED ORDER — FENTANYL CITRATE 0.05 MG/ML IJ SOLN
INTRAMUSCULAR | Status: AC
  Filled 2011-04-16: qty 2

## 2011-04-16 MED ORDER — SENNOSIDES-DOCUSATE SODIUM 8.6-50 MG PO TABS
2.0000 | ORAL_TABLET | Freq: Every day | ORAL | Status: DC
  Administered 2011-04-16 – 2011-04-18 (×3): 2 via ORAL

## 2011-04-16 MED ORDER — LANOLIN HYDROUS EX OINT: 1.0000 "application " | TOPICAL_OINTMENT | CUTANEOUS | Status: DC | PRN

## 2011-04-16 MED ORDER — NALBUPHINE HCL 10 MG/ML IJ SOLN
5.0000 mg | INTRAMUSCULAR | Status: DC | PRN
  Filled 2011-04-16: qty 1

## 2011-04-16 MED ORDER — ONDANSETRON HCL 4 MG/2ML IJ SOLN
4.0000 mg | INTRAMUSCULAR | Status: DC | PRN
  Administered 2011-04-16: 4 mg via INTRAVENOUS
  Filled 2011-04-16: qty 2

## 2011-04-16 MED ORDER — LACTATED RINGERS IV SOLN
INTRAVENOUS | Status: DC | PRN
  Administered 2011-04-16 (×2): via INTRAVENOUS

## 2011-04-16 MED ORDER — KETOROLAC TROMETHAMINE 60 MG/2ML IM SOLN
INTRAMUSCULAR | Status: AC
  Administered 2011-04-16: 60 mg via INTRAMUSCULAR
  Filled 2011-04-16: qty 2

## 2011-04-16 MED ORDER — DIPHENHYDRAMINE HCL 50 MG/ML IJ SOLN: 25.0000 mg | INTRAMUSCULAR | Status: DC | PRN

## 2011-04-16 MED ORDER — SCOPOLAMINE 1 MG/3DAYS TD PT72
1.0000 | MEDICATED_PATCH | Freq: Once | TRANSDERMAL | Status: AC
  Administered 2011-04-16: 1.5 mg via TRANSDERMAL

## 2011-04-16 MED ORDER — SIMETHICONE 80 MG PO CHEW
80.0000 mg | CHEWABLE_TABLET | ORAL | Status: DC | PRN
  Administered 2011-04-18: 80 mg via ORAL

## 2011-04-16 MED ORDER — OXYTOCIN 20 UNITS IN LACTATED RINGERS INFUSION - SIMPLE
INTRAVENOUS | Status: DC | PRN
  Administered 2011-04-16 (×2): 20 [IU] via INTRAVENOUS

## 2011-04-16 MED ORDER — CEFAZOLIN SODIUM-DEXTROSE 2-3 GM-% IV SOLR
2.0000 g | INTRAVENOUS | Status: AC
  Administered 2011-04-16: 2 g via INTRAVENOUS
  Filled 2011-04-16: qty 50

## 2011-04-16 MED ORDER — MEPERIDINE HCL 25 MG/ML IJ SOLN
INTRAMUSCULAR | Status: AC
  Filled 2011-04-16: qty 1

## 2011-04-16 MED ORDER — FENTANYL CITRATE 0.05 MG/ML IJ SOLN
INTRAMUSCULAR | Status: DC | PRN
  Administered 2011-04-16: 50 ug via EPIDURAL

## 2011-04-16 SURGICAL SUPPLY — 40 items
BENZOIN TINCTURE PRP APPL 2/3 (GAUZE/BANDAGES/DRESSINGS) ×2 IMPLANT
CANISTER WOUND CARE 500ML ATS (WOUND CARE) IMPLANT
CHLORAPREP W/TINT 26ML (MISCELLANEOUS) ×2 IMPLANT
CLOTH BEACON ORANGE TIMEOUT ST (SAFETY) ×2 IMPLANT
CONTAINER PREFILL 10% NBF 15ML (MISCELLANEOUS) IMPLANT
DRESSING TELFA 8X3 (GAUZE/BANDAGES/DRESSINGS) ×2 IMPLANT
DRSG VAC ATS LRG SENSATRAC (GAUZE/BANDAGES/DRESSINGS) IMPLANT
DRSG VAC ATS MED SENSATRAC (GAUZE/BANDAGES/DRESSINGS) IMPLANT
DRSG VAC ATS SM SENSATRAC (GAUZE/BANDAGES/DRESSINGS) IMPLANT
ELECT REM PT RETURN 9FT ADLT (ELECTROSURGICAL) ×2
ELECTRODE REM PT RTRN 9FT ADLT (ELECTROSURGICAL) ×1 IMPLANT
EXTRACTOR VACUUM M CUP 4 TUBE (SUCTIONS) IMPLANT
GAUZE SPONGE 4X4 12PLY STRL LF (GAUZE/BANDAGES/DRESSINGS) ×2 IMPLANT
GLOVE BIO SURGEON STRL SZ 6.5 (GLOVE) ×4 IMPLANT
GOWN PREVENTION PLUS LG XLONG (DISPOSABLE) ×6 IMPLANT
KIT ABG SYR 3ML LUER SLIP (SYRINGE) IMPLANT
NEEDLE HYPO 25X5/8 SAFETYGLIDE (NEEDLE) ×2 IMPLANT
NS IRRIG 1000ML POUR BTL (IV SOLUTION) ×2 IMPLANT
PACK C SECTION WH (CUSTOM PROCEDURE TRAY) ×2 IMPLANT
PAD ABD 7.5X8 STRL (GAUZE/BANDAGES/DRESSINGS) ×2 IMPLANT
RTRCTR C-SECT PINK 25CM LRG (MISCELLANEOUS) IMPLANT
RTRCTR C-SECT PINK 34CM XLRG (MISCELLANEOUS) IMPLANT
SLEEVE SCD COMPRESS KNEE MED (MISCELLANEOUS) IMPLANT
STAPLER VISISTAT 35W (STAPLE) IMPLANT
STRIP CLOSURE SKIN 1/2X4 (GAUZE/BANDAGES/DRESSINGS) ×2 IMPLANT
SUT MNCRL 0 VIOLET CTX 36 (SUTURE) ×2 IMPLANT
SUT MNCRL AB 3-0 PS2 27 (SUTURE) IMPLANT
SUT MONOCRYL 0 CTX 36 (SUTURE) ×2
SUT PDS AB 0 CTX 36 PDP370T (SUTURE) ×2 IMPLANT
SUT PLAIN 0 NONE (SUTURE) IMPLANT
SUT VIC AB 0 CT1 27 (SUTURE)
SUT VIC AB 0 CT1 27XBRD ANBCTR (SUTURE) IMPLANT
SUT VIC AB 2-0 CT1 (SUTURE) IMPLANT
SUT VIC AB 2-0 CT1 27 (SUTURE) ×3
SUT VIC AB 2-0 CT1 TAPERPNT 27 (SUTURE) ×3 IMPLANT
SUT VIC AB 2-0 SH 27 (SUTURE)
SUT VIC AB 2-0 SH 27XBRD (SUTURE) IMPLANT
TOWEL OR 17X24 6PK STRL BLUE (TOWEL DISPOSABLE) ×4 IMPLANT
TRAY FOLEY CATH 14FR (SET/KITS/TRAYS/PACK) ×2 IMPLANT
WATER STERILE IRR 1000ML POUR (IV SOLUTION) ×2 IMPLANT

## 2011-04-16 NOTE — Progress Notes (Signed)
Alvino Blood CNM at the bedside. Orders received to labor down another hour and begin pushing again at 0700. Will continue to monitor.

## 2011-04-16 NOTE — Progress Notes (Signed)
Pt reports to RN while standing at bedside she has a head ache and has had it all day.  Pt says she thinks its due to not wearing glasses all day and not eating.  Pt. Is now wearing glasses and has had small amount to eat and encouraged to order food.  Pt appears irritable with itching and declines benadryl at this time.  Pt. Is very sleeping and will not respond verbally when asked questions while visible awake. Pt. Seems exhausted comfort measures provided.  RN at bedside earlier in shift when Anesthesia came to check on pt. And pt. Refused to talk.  Pt. Has been provided multiple opportunities to rest in between checks and tolerated ambulating to bathroom fair.

## 2011-04-16 NOTE — Progress Notes (Signed)
Alvino Blood CNM notified of pt status, FHR, and SVE. Orders received to labor down and recheck in an hour. Will continue to monitor.

## 2011-04-16 NOTE — Progress Notes (Signed)
Krista Meza is a 20 y.o. G1P0 at [redacted]w[redacted]d by ultrasound admitted for active labor  Subjective: Pushing x 1 hour, little progress noted. Fetal vertex remains at +1/+2.   Objective: BP 93/39  Pulse 82  Temp(Src) 98 F (36.7 C) (Oral)  Resp 20  Ht 5\' 1"  (1.549 m)  Wt 87.544 kg (193 lb)  BMI 36.47 kg/m2  SpO2 100%      FHT:  FHR: 135 bpm, variability: moderate,  accelerations:  Present,  decelerations:  Present variables and earlies noted. UC:   regular, every 2-3 minutes SVE:   Dilation: 10 Effacement (%): 100 Station: 0;+1 Exam by:: Krista Coombe RN  Labs: Lab Results  Component Value Date   WBC 12.8* 04/15/2011   HGB 11.7* 04/15/2011   HCT 35.3* 04/15/2011   MCV 89.6 04/15/2011   PLT 245 04/15/2011    Assessment / Plan: slow descent, heavy epidural. Turned down rate of epidural, Sitting up in [place] to labor down. WIll consult with Dr. Tamela Meza.   Labor: Complete Dilation at 0335 Preeclampsia:  no signs or symptoms of toxicity Fetal Wellbeing:  Category II Pain Control:  Epidural I/D:  n/a Anticipated MOD:  NSVD  Krista Meza CNM 04/16/2011, 5:40 AM

## 2011-04-16 NOTE — Op Note (Signed)
Cesarean Section Procedure Note   Krista Meza   04/15/2011 - 04/16/2011  Indications: arrest of descent   Pre-operative Diagnosis: Failure to Descend.   Post-operative Diagnosis: Same   Surgeon: Antionette Char A  Assistants: none  Anesthesia: epidural  Procedure Details:  The patient was seen in the Holding Room. The risks, benefits, complications, treatment options, and expected outcomes were discussed with the patient. The patient concurred with the proposed plan, giving informed consent. The patient was identified as [name] and the procedure verified as C-Section Delivery. A Time Out was held and the above information confirmed.  After induction of anesthesia, the patient was draped and prepped in the usual sterile manner. A transverse incision was made and carried down through the subcutaneous tissue to the fascia. The fascial incision was made and extended transversely. The fascia was separated from the underlying rectus tissue superiorly and inferiorly. The peritoneum was identified and entered. The peritoneal incision was extended longitudinally. The utero-vesical peritoneal reflection was incised transversely. A low transverse uterine incision was made. Delivered from cephalic presentation was a 6 lb 14 oz living newborn female infant(s) with Apgar scores of 9 at one minute and 9 at five minutes. A cord ph was not sent. The umbilical cord was clamped and cut cord. A sample was obtained for evaluation. The placenta was removed Intact and appeared normal.  The uterine incision was closed with running locked sutures of 1-0 Monocryl. A second imbricating layer of the same suture was placed.  Hemostasis was observed. The paracolic gutters were irrigated. The fascia was then reapproximated with running sutures of 1-0Vicryl. The subcuticular closure was performed using 3-0 Monocryl.  Instrument, sponge, and needle counts were correct prior the abdominal closure and were  correct at the conclusion of the case.    Findings: Persistent OP   Estimated Blood Loss: 600 ml  Total IV Fluids: per Anesthesiology  Urine Output: blood tinged--blood tinged prior to start  Specimens: None  Complications: no complications  Disposition: PACU - hemodynamically stable.  Maternal Condition: stable   Baby condition / location:  nursery-stable    Signed: Surgeon(s): Roseanna Rainbow, MD

## 2011-04-16 NOTE — Addendum Note (Signed)
Addendum  created 04/16/11 1637 by Doreene Burke   Modules edited:Notes Section

## 2011-04-16 NOTE — Progress Notes (Signed)
Orders received to cut back epidural dose to 10 mg/hr. Will continue to monitor.

## 2011-04-16 NOTE — Progress Notes (Signed)
Krista Meza is a 20 y.o. G1P0 at [redacted]w[redacted]d by ultrasound admitted for active labor  Subjective: Comfortable post epidural. Resting well.  Objective: BP 120/81  Pulse 83  Temp(Src) 98.2 F (36.8 C) (Oral)  Resp 22  Ht 5\' 1"  (1.549 m)  Wt 87.544 kg (193 lb)  BMI 36.47 kg/m2  SpO2 100%      FHT:  FHR: 125 bpm, variability: moderate,  accelerations:  Present,  decelerations:  Absent UC:   regular, every 2-3 minutes SVE:   Dilation: 7 Effacement (%): 100 Station: 0 Exam by:: R. Hamby CNM  Labs: Lab Results  Component Value Date   WBC 12.8* 04/15/2011   HGB 11.7* 04/15/2011   HCT 35.3* 04/15/2011   MCV 89.6 04/15/2011   PLT 245 04/15/2011    Assessment / Plan: spontaneous labor, has been progressing well. Will start pitocin due to minimal cervical change over last 2 hours. Consult with Dr. Tamela Oddi prn.  Labor: progressing, augment with pitocin. Preeclampsia:  no signs or symptoms of toxicity Fetal Wellbeing:  Category I Pain Control:  Epidural I/D:  n/a Anticipated MOD:  NSVD  Krista Meza CNM 04/16/2011, 1:14 AM

## 2011-04-16 NOTE — Anesthesia Postprocedure Evaluation (Signed)
  Anesthesia Post-op Note  Patient: Krista Meza  Procedure(s) Performed:  CESAREAN SECTION   Patient is awake, responsive, moving her legs, and has signs of resolution of her numbness. Pain and nausea are reasonably well controlled. Vital signs are stable and clinically acceptable. Oxygen saturation is clinically acceptable. There are no apparent anesthetic complications at this time. Patient is ready for discharge.

## 2011-04-16 NOTE — Progress Notes (Signed)
Alvino Blood CNM notified of pt status, FHR, and SVE. Will continue to monitor.

## 2011-04-16 NOTE — Transfer of Care (Signed)
Immediate Anesthesia Transfer of Care Note  Patient: Krista Meza  Procedure(s) Performed:  CESAREAN SECTION  Patient Location: PACU  Anesthesia Type: Epidural  Level of Consciousness: awake, alert , oriented and patient cooperative  Airway & Oxygen Therapy: Patient Spontanous Breathing  Post-op Assessment: Report given to PACU RN and Post -op Vital signs reviewed and stable  Post vital signs: Reviewed and stable  Complications: No apparent anesthesia complications

## 2011-04-16 NOTE — Progress Notes (Signed)
Krista Meza is a 20 y.o. G1P0 at [redacted]w[redacted]d by LMP admitted for induction of labor due to Post dates. Due date 11/15.  Subjective: Uncomfortable  Objective: BP 114/65  Pulse 101  Temp(Src) 98.2 F (36.8 C) (Oral)  Resp 22  Ht 5\' 1"  (1.549 m)  Wt 87.544 kg (193 lb)  BMI 36.47 kg/m2  SpO2 100%      FHT:  FHR: 140 bpm, variability: moderate,  accelerations:  Abscent,  decelerations:  Absent UC:   regular, every 2 minutes SVE:   Dilation: 10 Effacement (%): 100 Station: 0;+1 Exam by:: Everrett Coombe RN  Labs: Lab Results  Component Value Date   WBC 12.8* 04/15/2011   HGB 11.7* 04/15/2011   HCT 35.3* 04/15/2011   MCV 89.6 04/15/2011   PLT 245 04/15/2011    Assessment / Plan: Arrest of decent  Labor: see above Preeclampsia:  n/a Fetal Wellbeing:  Category I Pain Control:  Epidural I/D:  n/a Anticipated MOD:  C/D  JACKSON-MOORE,Kyliegh Jester A 04/16/2011, 8:08 AM

## 2011-04-16 NOTE — Progress Notes (Signed)
Krista Meza is a 20 y.o. G1P0 at [redacted]w[redacted]d by LMP admitted for active labor  Subjective: Pt feeling rectal pressure.  Objective: BP 112/63  Pulse 94  Temp(Src) 98 F (36.7 C) (Oral)  Resp 18  Ht 5\' 1"  (1.549 m)  Wt 87.544 kg (193 lb)  BMI 36.47 kg/m2  SpO2 100%      FHT:  FHR: 140 bpm, variability: moderate,  accelerations:  Present,  decelerations:  Present early decels UC:   regular, every 2 minutes SVE:   Dilation: 10 Effacement (%): 100 Station: 0;+1 Exam by:: Everrett Coombe RN  Labs: Lab Results  Component Value Date   WBC 12.8* 04/15/2011   HGB 11.7* 04/15/2011   HCT 35.3* 04/15/2011   MCV 89.6 04/15/2011   PLT 245 04/15/2011    Assessment / Plan: Spontaneous labor, progressing normally, labored down, +2 station will begin pushing. WIll consult with Dr. Tamela Oddi as needed.  Labor: Progressing normally Preeclampsia:  no signs or symptoms of toxicity Fetal Wellbeing:  Category I Pain Control:  Epidural I/D:  n/a Anticipated MOD:  NSVD  [name] CNM 04/16/2011, 4:36 AM

## 2011-04-16 NOTE — Anesthesia Postprocedure Evaluation (Signed)
    Anesthesia Post Note  Patient: Krista Meza  Procedure(s) Performed:  CESAREAN SECTION  Anesthesia type: Epidural  Patient location: Mother/Baby  Post pain: Pain level controlled  Post assessment: Post-op Vital signs reviewed  Last Vitals:  Filed Vitals:   04/16/11 1420  BP:   Pulse:   Temp:   Resp: 18    Post vital signs: Reviewed  Level of consciousness: awake, patient request for post op visit to be done at a later time.  Complications:

## 2011-04-17 LAB — HEMOGLOBIN: Hemoglobin: 9.5 g/dL — ABNORMAL LOW (ref 12.0–15.0)

## 2011-04-17 NOTE — Progress Notes (Signed)
Patient ID: Krista Meza, female   DOB: 1990-12-25, 20 y.o.   MRN: [id number] Subjective: POD# 1 s/p Cesarean Delivery.  Indications: failure to progress  RH status/Rubella reviewed. Feeding: breast Patient reports tolerating PO.  Denies HA/SOB/C/P/N/V/dizziness.  Reports flatus or BM. Breast symptoms: no.  She reports vaginal bleeding as normal, without clots.  She is ambulating, urinating without difficulty.     Objective: Vital signs in last 24 hours: BP 113/65  Pulse 96  Temp(Src) 98.7 F (37.1 C) (Oral)  Resp 18  Ht 5\' 1"  (1.549 m)  Wt 87.544 kg (193 lb)  BMI 36.47 kg/m2  SpO2 98%  Breastfeeding? Unknown       Physical Exam:  General: alert CV: Regular rate and rhythm Resp: clear Abdomen: soft, nontender, normal bowel sounds Lochia: none Uterine Fundus: firm, below umbilicus, nontender Incision: dressing C/D Ext: extremities normal, atraumatic, no cyanosis or edema    Basename 04/17/11 0547 04/15/11 1930  HGB 9.5* 11.7*  HCT -- 35.3*      Assessment/Plan: 20 y.o.  status post Cesarean section. POD# 1.   Doing well, stable.              Advance diet as tolerated Start po pain meds D/C foley  HLIV  Ambulate IS Routine post-op care  JACKSON-MOORE,Krista Meza 04/17/2011, 9:35 AM

## 2011-04-17 NOTE — Progress Notes (Signed)
UR Chart review completed.  

## 2011-04-18 NOTE — Progress Notes (Signed)
Patient ID: Krista Meza, female   DOB: [birth year], 20 y.o.   MRN: [id number] Postop day 2 Vital signs normal Fundus firm Legs negative No complaints

## 2011-04-19 NOTE — Discharge Summary (Signed)
Obstetric Discharge Summary Reason for Admission: onset of labor Prenatal Procedures: none Intrapartum Procedures: spontaneous vaginal delivery Postpartum Procedures: none Complications-Operative and Postpartum: none Hemoglobin  Date Value Range Status  04/17/2011 9.5* 12.0-15.0 (g/dL) Final     DELTA CHECK NOTED     REPEATED TO VERIFY     HCT  Date Value Range Status  04/15/2011 35.3* 36.0-46.0 (%) Final    Discharge Diagnoses: Term Pregnancy-delivered  Discharge Information: Date: 04/19/2011 Activity: pelvic rest Diet: routine Medications: Percocet Condition: stable Instructions: refer to practice specific booklet Discharge to: home Follow-up Information    Follow up with Antionette Char A, MD. Call in 6 weeks.   Contact information:   [address] [place] [address] [phone]          Newborn Data: Live born female  Birth Weight: 6 lb 14.1 oz (3120 g) APGAR: 9, 9  Home with mother.  [name],[name] 04/19/2011, 6:45 AM

## 2011-04-20 ENCOUNTER — Encounter (HOSPITAL_COMMUNITY): Payer: Self-pay | Admitting: Obstetrics & Gynecology

## 2011-06-14 ENCOUNTER — Encounter (HOSPITAL_COMMUNITY): Payer: Self-pay | Admitting: *Deleted

## 2011-06-14 ENCOUNTER — Inpatient Hospital Stay (HOSPITAL_COMMUNITY)
Admission: AD | Admit: 2011-06-14 | Discharge: 2011-06-14 | Disposition: A | Discharge status: Home / Self Care | Payer: Medicaid Other | Source: Ambulatory Visit | Attending: Obstetrics & Gynecology | Admitting: Obstetrics & Gynecology

## 2011-06-14 DIAGNOSIS — N939 Abnormal uterine and vaginal bleeding, unspecified: Secondary | ICD-10-CM

## 2011-06-14 DIAGNOSIS — N949 Unspecified condition associated with female genital organs and menstrual cycle: Secondary | ICD-10-CM | POA: Insufficient documentation

## 2011-06-14 DIAGNOSIS — N938 Other specified abnormal uterine and vaginal bleeding: Secondary | ICD-10-CM | POA: Insufficient documentation

## 2011-06-14 DIAGNOSIS — N898 Other specified noninflammatory disorders of vagina: Secondary | ICD-10-CM

## 2011-06-14 LAB — HEMOGLOBIN AND HEMATOCRIT, BLOOD
HCT: 35.9 % — ABNORMAL LOW (ref 36.0–46.0)
Hemoglobin: 11.8 g/dL — ABNORMAL LOW (ref 12.0–15.0)

## 2011-06-14 LAB — WET PREP, GENITAL
Clue Cells Wet Prep HPF POC: NONE SEEN
WBC, Wet Prep HPF POC: NONE SEEN

## 2011-06-14 LAB — URINALYSIS, ROUTINE W REFLEX MICROSCOPIC
Nitrite: NEGATIVE
Specific Gravity, Urine: 1.02 (ref 1.005–1.030)
Urobilinogen, UA: 0.2 mg/dL (ref 0.0–1.0)

## 2011-06-14 LAB — URINE MICROSCOPIC-ADD ON

## 2011-06-14 LAB — POCT PREGNANCY, URINE: Preg Test, Ur: NEGATIVE

## 2011-06-14 NOTE — ED Notes (Signed)
Pt reports started on BCP end of December after her period. Has had 3 episodes this months of menstrual like bleeding.

## 2011-06-14 NOTE — Progress Notes (Signed)
Was treated for a UTI finished medication on 1/14

## 2011-06-14 NOTE — Progress Notes (Signed)
Has had bleeding on and off, had period in December 24-30, then 1/2-10, since 1/17 first started off as spotting then heavier, having sharp pain on right side and back, stomach ache, C/S on 04/16/11

## 2011-06-14 NOTE — ED Provider Notes (Signed)
History     Chief Complaint  Patient presents with  . Abdominal Pain  . Back Pain  . Vaginal Bleeding   HPI Krista Meza is 21 y.o. G1P1001 spoke to the office (Dr. Marcia Brash) on Friday and was told to come here for evaluation but states she had too much homework.  Is here now because she is concerned about the heavy bleeding and cramps.  Has mid to lower back pain.  Delivered Thanksgiving day.  Bled X 3 weeks, then had period 12/24.  Began bleeding again 1/2-1/10 and again 1/17 until now.  Is taking progestin only birth control pill.  Has not nursed in 4 weeks.  Is planning Nexplanon, waiting for their call.  Recent UTI tx with antibiotics.     Past Medical History  Diagnosis Date  . Heart palpitations   . Heart murmur     palpitations  . Asthma     as child, inhaler for a year  . Urinary tract infection     Past Surgical History  Procedure Date  . No past surgeries   . Cesarean section 04/16/2011    Procedure: CESAREAN SECTION;  Surgeon: Roseanna Rainbow, MD;  Location: [facility];  Service: Gynecology;  Laterality: N/A;    Family History  Problem Relation Age of Onset  . Anesthesia problems Neg Hx   . Hypotension Neg Hx   . Malignant hyperthermia Neg Hx   . Pseudochol deficiency Neg Hx     History  Substance Use Topics  . Smoking status: Never Smoker   . Smokeless tobacco: Never Used  . Alcohol Use: No    Allergies:  Allergies  Allergen Reactions  . Amoxicillin Hives  . Penicillins Hives    Pt is unsure if she is allergic to penicillin or amoxicillin    Prescriptions prior to admission  Medication Sig Dispense Refill  . acetaminophen (TYLENOL) 500 MG tablet Take 500 mg by mouth every 6 (six) hours as needed. For pain      . Prenatal Vit-Fe Fumarate-FA (PRENATAL MULTIVITAMIN) TABS Take 1 tablet by mouth daily.      Marland Kitchen PRESCRIPTION MEDICATION Take 1 tablet by mouth daily. Birth control pill        Review of Systems  Genitourinary:       +  vaginal bleeding  Musculoskeletal: Positive for back pain (mid-lower back pain).   Physical Exam   Blood pressure 118/69, pulse 89, temperature 99.4 F (37.4 C), temperature source Oral, resp. rate 16, height 5\' 1"  (1.549 m), weight 187 lb (84.823 kg), last menstrual period 06/04/2011, unknown if currently breastfeeding.  Physical Exam  Constitutional: She is oriented to person, place, and time. She appears well-developed and well-nourished. No distress.  HENT:  Head: Normocephalic.  Neck: Normal range of motion.  Cardiovascular: Normal rate.   Respiratory: Effort normal.  GI: Soft. She exhibits no distension and no mass. There is no tenderness. There is no rebound and no guarding.  Genitourinary: Uterus is tender (mild). Uterus is not enlarged. Cervix exhibits no motion tenderness, no discharge and no friability. Right adnexum displays no mass, no tenderness and no fullness. Left adnexum displays no mass, no tenderness and no fullness. There is bleeding (small amount of blood without clot) around the vagina. No tenderness around the vagina.  Neurological: She is alert and oriented to person, place, and time.  Skin: Skin is warm and dry.    Results for orders placed during the hospital encounter of 06/14/11 (from the past 24  hour(s))  URINALYSIS, ROUTINE W REFLEX MICROSCOPIC     Status: Abnormal   Collection Time   06/14/11  6:35 PM      Component Value Range   Color, Urine YELLOW  YELLOW    APPearance HAZY (*) CLEAR    Specific Gravity, Urine 1.020  1.005 - 1.030    pH 6.0  5.0 - 8.0    Glucose, UA NEGATIVE  NEGATIVE (mg/dL)   Hgb urine dipstick LARGE (*) NEGATIVE    Bilirubin Urine NEGATIVE  NEGATIVE    Ketones, ur NEGATIVE  NEGATIVE (mg/dL)   Protein, ur NEGATIVE  NEGATIVE (mg/dL)   Urobilinogen, UA 0.2  0.0 - 1.0 (mg/dL)   Nitrite NEGATIVE  NEGATIVE    Leukocytes, UA TRACE (*) NEGATIVE   URINE MICROSCOPIC-ADD ON     Status: Abnormal   Collection Time   06/14/11  6:35 PM       Component Value Range   Squamous Epithelial / LPF FEW (*) RARE    WBC, UA 3-6  <3 (WBC/hpf)   RBC / HPF TOO NUMEROUS TO COUNT  <3 (RBC/hpf)   Bacteria, UA FEW (*) RARE   POCT PREGNANCY, URINE     Status: Normal   Collection Time   06/14/11  6:37 PM      Component Value Range   Preg Test, Ur NEGATIVE    WET PREP, GENITAL     Status: Normal   Collection Time   06/14/11  7:26 PM      Component Value Range   Yeast, Wet Prep NONE SEEN  NONE SEEN    Trich, Wet Prep NONE SEEN  NONE SEEN    Clue Cells, Wet Prep NONE SEEN  NONE SEEN    WBC, Wet Prep HPF POC NONE SEEN  NONE SEEN   HEMOGLOBIN AND HEMATOCRIT, BLOOD     Status: Abnormal   Collection Time   06/14/11  7:27 PM      Component Value Range   Hemoglobin 11.8 (*) 12.0 - 15.0 (g/dL)   HCT 62.1 (*) 30.8 - 46.0 (%)    MAU Course  Procedures GC/CHl culture to lab MDM   Assessment and Plan  A:  Irregular vaginal bleeding  P:  Followup with your doctor for discussion of treatment of abnormal vaginal bleeding.  Joeleen Wortley,EVE M 06/14/2011, 7:08 PM   Matt Holmes, NP 06/14/11 2030  Matt Holmes, NP 06/14/11 2031

## 2011-06-15 LAB — GC/CHLAMYDIA PROBE AMP, GENITAL
Chlamydia, DNA Probe: NEGATIVE
GC Probe Amp, Genital: NEGATIVE

## 2011-07-31 DIAGNOSIS — F329 Major depressive disorder, single episode, unspecified: Secondary | ICD-10-CM | POA: Insufficient documentation

## 2011-07-31 DIAGNOSIS — M2550 Pain in unspecified joint: Secondary | ICD-10-CM | POA: Insufficient documentation

## 2011-07-31 DIAGNOSIS — R52 Pain, unspecified: Secondary | ICD-10-CM | POA: Insufficient documentation

## 2011-08-03 DIAGNOSIS — E559 Vitamin D deficiency, unspecified: Secondary | ICD-10-CM | POA: Insufficient documentation

## 2011-12-20 ENCOUNTER — Encounter (HOSPITAL_COMMUNITY): Payer: Self-pay | Admitting: *Deleted

## 2011-12-20 ENCOUNTER — Emergency Department (HOSPITAL_COMMUNITY)
Admission: EM | Admit: 2011-12-20 | Discharge: 2011-12-20 | Disposition: A | Discharge status: Home / Self Care | Payer: Medicaid Other | Attending: Emergency Medicine | Admitting: Emergency Medicine

## 2011-12-20 DIAGNOSIS — R51 Headache: Secondary | ICD-10-CM

## 2011-12-20 MED ORDER — METOCLOPRAMIDE HCL 5 MG/ML IJ SOLN
INTRAMUSCULAR | Status: AC
  Administered 2011-12-20: 10 mg
  Filled 2011-12-20: qty 4

## 2011-12-20 MED ORDER — KETOROLAC TROMETHAMINE 30 MG/ML IJ SOLN
30.0000 mg | Freq: Once | INTRAMUSCULAR | Status: AC
  Administered 2011-12-20: 30 mg via INTRAVENOUS
  Filled 2011-12-20: qty 1

## 2011-12-20 MED ORDER — SODIUM CHLORIDE 0.9 % IV BOLUS (SEPSIS)
1000.0000 mL | Freq: Once | INTRAVENOUS | Status: AC
  Administered 2011-12-20: 1000 mL via INTRAVENOUS

## 2011-12-20 MED ORDER — METOCLOPRAMIDE HCL 5 MG/ML IJ SOLN
20.0000 mg | Freq: Once | INTRAVENOUS | Status: DC
  Filled 2011-12-20: qty 4

## 2011-12-20 MED ORDER — DIPHENHYDRAMINE HCL 50 MG/ML IJ SOLN
25.0000 mg | Freq: Once | INTRAMUSCULAR | Status: AC
  Administered 2011-12-20: 12:00:00 via INTRAVENOUS
  Filled 2011-12-20: qty 1

## 2011-12-20 MED ORDER — DEXTROSE 5 % IV SOLN: 20.0000 mg | Freq: Once | INTRAVENOUS | Status: DC

## 2011-12-20 NOTE — ED Provider Notes (Signed)
History    Scribed for Gerhard Munch, MD, the patient was seen in room TR08C/TR08C. This chart was scribed by Katha Cabal.   CSN: [id number]  Arrival date & time 12/20/11  1009   First MD Initiated Contact with Patient 12/20/11 1106      Chief Complaint  Patient presents with  . Headache    (Consider location/radiation/quality/duration/timing/severity/associated sxs/prior treatment) HPI Gerhard Munch, MD entered patient's room at 11:47 AM   Krista Meza is a 21 y.o. female who presents to the Emergency Department complaining of intermittent moderate to severe headache for the past 24 hours.   Patient took prescription headache medicationss and Excedrin migraine without relief.   Aggravated by light and sound.  Characteristics are similar to previous headaches but experiencing more nausea with this episode.  Symptoms are not associated with confusion, disorientation, loss of bowel or bladder control, fever, SOB, chest pain or abdominal pain.  Patient with history of minor heart murmur.       Past Medical History  Diagnosis Date  . Heart palpitations   . Heart murmur     palpitations  . Asthma     as child, inhaler for a year  . Urinary tract infection     Past Surgical History  Procedure Date  . No past surgeries   . Cesarean section 04/16/2011    Procedure: CESAREAN SECTION;  Surgeon: Roseanna Rainbow, MD;  Location: [facility];  Service: Gynecology;  Laterality: N/A;    Family History  Problem Relation Age of Onset  . Anesthesia problems Neg Hx   . Hypotension Neg Hx   . Malignant hyperthermia Neg Hx   . Pseudochol deficiency Neg Hx     History  Substance Use Topics  . Smoking status: Never Smoker   . Smokeless tobacco: Never Used  . Alcohol Use: No    OB History    Grav Para Term Preterm Abortions TAB SAB Ect Mult Living   1 1 1       1       Review of Systems  All other systems reviewed and are negative.   Remaining review of systems  negative except as noted in the HPI.   Allergies  Amoxicillin and Penicillins  Home Medications   Current Outpatient Rx  Name Route Sig Dispense Refill  . ETONOGESTREL 68 MG [place] IMPL Subcutaneous Inject 1 each into the skin once. Every 3 years in February.    Marland Kitchen METOPROLOL SUCCINATE ER 25 MG PO TB24 Oral Take 25 mg by mouth daily.    . SUMATRIPTAN SUCCINATE 100 MG PO TABS Oral Take 100 mg by mouth every 2 (two) hours as needed.    . TOPIRAMATE 25 MG PO TABS Oral Take 25 mg by mouth 2 (two) times daily.      BP 119/74  Pulse 81  Temp 99.1 F (37.3 C) (Oral)  Resp 20  SpO2 100%  Breastfeeding? No  Physical Exam  Nursing note and vitals reviewed. Constitutional: She is oriented to person, place, and time. She appears well-developed and well-nourished. No distress.  HENT:  Head: Normocephalic and atraumatic.  Eyes: Conjunctivae and EOM are normal.  Cardiovascular: Normal rate and regular rhythm.   Pulmonary/Chest: Effort normal and breath sounds normal. No stridor. No respiratory distress.  Abdominal: She exhibits no distension.  Musculoskeletal: She exhibits no edema.  Neurological: She is alert and oriented to person, place, and time. No cranial nerve deficit. Coordination normal.  Skin: Skin is warm and dry.  Psychiatric: She has a normal mood and affect.    ED Course  Procedures (including critical care time)     COORDINATION OF CARE: 11:51 AM  Physical exam complete.  Pain control.  12:00 PM  IV fluids, Benadryl and Toradol ordered.  12:15 PM  Reglan ordered.      LABS / RADIOLOGY:   Labs Reviewed - No data to display No results found.       MDM  This 21 year old female with history of migraine headaches presents in no distress with concern of persistent headache.  On exam the patient is uncomfortable appearing, resting with her child on her chest.  She is afebrile.  The patient has no notable neurologic findings.  Given her description of chronic  migraines, once she had substantial reduction in her symptoms she was discharged in stable condition with instructions to follow up with her primary care physician and/or neurologist to ensure that she is taking appropriate medication for her migraines.       MEDICATIONS GIVEN IN THE E.D. Scheduled Meds:    . diphenhydrAMINE  25 mg Intravenous Once  . ketorolac  30 mg Intravenous Once  . metoCLOPramide (REGLAN) injection  20 mg Intravenous Once  . metoCLOPramide      . sodium chloride  1,000 mL Intravenous Once  . DISCONTD: metoCLOPramide (REGLAN) injection  20 mg Intravenous Once   Continuous Infusions:   Onre-eval the patient was much better.   IMPRESSION: 1. Headache        I personally performed the services described in this documentation, which was scribed in my presence. The recorded information has been reviewed and considered.        Gerhard Munch, MD 12/20/11 [phone]

## 2011-12-20 NOTE — ED Notes (Signed)
Pt. Given Reglan 20mg  vis IVP instead of the piggyback.  Dr.Lockwood put in the wrong order.  Per Dr Jeraldine Loots.

## 2011-12-20 NOTE — ED Notes (Signed)
Reports having frequent headaches, current headache x 24 hours. No relief with excedrin and meds at home. Having nausea, denies vomiting. Has hx of migraines. No acute distress noted at triage.

## 2012-06-02 ENCOUNTER — Ambulatory Visit: Payer: Medicaid Other | Attending: Obstetrics & Gynecology | Admitting: Physical Therapy

## 2012-06-12 ENCOUNTER — Encounter (HOSPITAL_COMMUNITY): Payer: Self-pay | Admitting: Family

## 2012-06-12 ENCOUNTER — Inpatient Hospital Stay (HOSPITAL_COMMUNITY): Payer: Medicaid Other

## 2012-06-12 ENCOUNTER — Inpatient Hospital Stay (HOSPITAL_COMMUNITY)
Admission: AD | Admit: 2012-06-12 | Discharge: 2012-06-12 | Disposition: A | Discharge status: Home / Self Care | Payer: Medicaid Other | Source: Ambulatory Visit | Attending: Obstetrics & Gynecology | Admitting: Obstetrics & Gynecology

## 2012-06-12 DIAGNOSIS — N83209 Unspecified ovarian cyst, unspecified side: Secondary | ICD-10-CM

## 2012-06-12 DIAGNOSIS — M545 Low back pain, unspecified: Secondary | ICD-10-CM | POA: Insufficient documentation

## 2012-06-12 DIAGNOSIS — R109 Unspecified abdominal pain: Secondary | ICD-10-CM | POA: Insufficient documentation

## 2012-06-12 HISTORY — DX: Anxiety disorder, unspecified: F41.9

## 2012-06-12 LAB — URINALYSIS, ROUTINE W REFLEX MICROSCOPIC
Bilirubin Urine: NEGATIVE
Ketones, ur: NEGATIVE mg/dL
Leukocytes, UA: NEGATIVE
Nitrite: NEGATIVE
Specific Gravity, Urine: >1.03 — ABNORMAL HIGH (ref 1.005–1.030)
Urobilinogen, UA: 0.2 mg/dL (ref 0.0–1.0)
pH: 6 (ref 5.0–8.0)

## 2012-06-12 LAB — POCT PREGNANCY, URINE: Preg Test, Ur: NEGATIVE

## 2012-06-12 NOTE — MAU Note (Signed)
Pt requests for test results to be given to her privately.

## 2012-06-12 NOTE — MAU Note (Signed)
Pt states LMP-05/22/2012, still having cramping, was all across abdomen, then moved to her back, now is primarily in r lower abdomen. Cannot feel the urge when she has to void, holds urine frequently. Denies burning or urgency, notes red vaginal discharge. Has had pain for 2 weeks

## 2012-06-12 NOTE — MAU Provider Note (Signed)
History     CSN: [id number]  Arrival date and time: 06/12/12 1443   First Provider Initiated Contact with Patient 06/12/12 1528      No chief complaint on file.  HPI Pt is not pregnant and presents with lower abdominal cramping for 2 weeks and lower back pain.  Her pain is worse on her right side. Had Nexplanon Feb 2013 and removed in Aug 2013 with RCM since removed with LMP 05/22/2012.  Her pain level is a 6-8/10.  She has had difficulty feeling urge to void but denies dysuria.  Past Medical History  Diagnosis Date  . Heart palpitations   . Heart murmur     palpitations  . Asthma     as child, inhaler for a year  . Urinary tract infection     Past Surgical History  Procedure Date  . No past surgeries   . Cesarean section 04/16/2011    Procedure: CESAREAN SECTION;  Surgeon: Roseanna Rainbow, MD;  Location: [facility];  Service: Gynecology;  Laterality: N/A;    Family History  Problem Relation Age of Onset  . Anesthesia problems Neg Hx   . Hypotension Neg Hx   . Malignant hyperthermia Neg Hx   . Pseudochol deficiency Neg Hx     History  Substance Use Topics  . Smoking status: Never Smoker   . Smokeless tobacco: Never Used  . Alcohol Use: No    Allergies:  Allergies  Allergen Reactions  . Amoxicillin Hives  . Penicillins Hives    Pt is unsure if she is allergic to penicillin or amoxicillin    Prescriptions prior to admission  Medication Sig Dispense Refill  . etonogestrel (NEXPLANON) 68 MG IMPL implant Inject 1 each into the skin once. Every 3 years in February.      . metoprolol succinate (TOPROL-XL) 25 MG 24 hr tablet Take 25 mg by mouth daily.      . SUMAtriptan (IMITREX) 100 MG tablet Take 100 mg by mouth every 2 (two) hours as needed.      . topiramate (TOPAMAX) 25 MG tablet Take 25 mg by mouth 2 (two) times daily.        Review of Systems  Constitutional: Negative for fever and chills.  Gastrointestinal: Positive for abdominal pain. Negative for  nausea, vomiting and diarrhea.  Genitourinary: Negative for dysuria and urgency.   Physical Exam   Blood pressure 123/85, pulse 97, temperature 98.1 F (36.7 C), temperature source Oral, resp. rate 16, height 5\' 1"  (1.549 m), weight 197 lb (89.359 kg), last menstrual period 05/22/2012, not currently breastfeeding.  Physical Exam  Nursing note and vitals reviewed. Constitutional: She is oriented to person, place, and time. She appears well-developed and well-nourished.  HENT:  Head: Normocephalic.  Eyes: Pupils are equal, round, and reactive to light.  Neck: Normal range of motion. Neck supple.  Respiratory: Effort normal.  GI: Soft. She exhibits no distension. There is tenderness. There is no rebound and no guarding.       No CVA tenderness  Genitourinary:       Small amount of pink tinged discharge in vault; cervix clean NT; right adnexal fullness noted with tenderness; uterus NSSC  Musculoskeletal: Normal range of motion.  Neurological: She is alert and oriented to person, place, and time.  Skin: Skin is warm and dry.  Psychiatric: She has a normal mood and affect.    MAU Course  Procedures Clinical Data: Pelvic pain for 2 weeks. LMP 05/22/2012.  TRANSABDOMINAL AND  TRANSVAGINAL ULTRASOUND OF PELVIS  Technique: Both transabdominal and transvaginal ultrasound  examinations of the pelvis were performed. Transabdominal technique  was performed for global imaging of the pelvis including uterus,  ovaries, adnexal regions, and pelvic cul-de-sac.  Comparison: None  Findings:  Uterus: Uterus is anteverted and measures 8.4 x 3.6 x 4.7 cm.  Myometrium is homogeneous. No focal uterine mass.  Endometrium: Normal in thickness and appearance. Measures a  maximum of 13 mm in thickness.  Right ovary: Measures 4.8 x 3.4 x 3.7 cm and contains a complex  cyst with thin internal lacy septations that measures 3.7 x 2.7 x  3.4 cm. This is likely a hemorrhagic cyst. Color Doppler flow is  seen  to the surrounding ovarian parenchyma. No flow is identified  within the complex cyst. Measures 3.5 x 1.5 x 1.6 cm.  Left ovary: Normal appearance/no adnexal mass  Other findings: Trace amount of free pelvic fluid peri  IMPRESSION:  3.7 cm probable hemorrhagic cyst right ovary. Short-interval follow  up ultrasound in 6-12 weeks is recommended, preferably during the  week following the patient's normal menses.  Original Report Authenticated By: Britta Mccreedy, M.D.  Results for orders placed during the hospital encounter of 06/12/12 (from the past 24 hour(s))  URINALYSIS, ROUTINE W REFLEX MICROSCOPIC     Status: Abnormal   Collection Time   06/12/12  3:15 PM      Component Value Range   Color, Urine YELLOW  YELLOW   APPearance CLEAR  CLEAR   Specific Gravity, Urine >1.030 (*) 1.005 - 1.030   pH 6.0  5.0 - 8.0   Glucose, UA NEGATIVE  NEGATIVE mg/dL   Hgb urine dipstick NEGATIVE  NEGATIVE   Bilirubin Urine NEGATIVE  NEGATIVE   Ketones, ur NEGATIVE  NEGATIVE mg/dL   Protein, ur NEGATIVE  NEGATIVE mg/dL   Urobilinogen, UA 0.2  0.0 - 1.0 mg/dL   Nitrite NEGATIVE  NEGATIVE   Leukocytes, UA NEGATIVE  NEGATIVE  POCT PREGNANCY, URINE     Status: Normal   Collection Time   06/12/12  3:24 PM      Component Value Range   Preg Test, Ur NEGATIVE  NEGATIVE  WET PREP, GENITAL     Status: Abnormal   Collection Time   06/12/12  3:30 PM      Component Value Range   Yeast Wet Prep HPF POC NONE SEEN  NONE SEEN   Trich, Wet Prep NONE SEEN  NONE SEEN   Clue Cells Wet Prep HPF POC NONE SEEN  NONE SEEN   WBC, Wet Prep HPF POC FEW (*) NONE SEEN   Discussed with Dr. Tamela Oddi- pt to f/u with her in the office this week Assessment and Plan  Right ovarian cyst- NSAID for pain F/u with Dr. Tamela Oddi for re-evaluation  LINEBERRY,SUSAN 06/12/2012, 3:28 PM

## 2012-06-12 NOTE — MAU Note (Signed)
Patient reports to MAU with c/o with lower back pain x 2-3 days, radiating to R lower abdominal cramping within last 48 hours.  LMP 12/29. Denies vaginal bleeding today; Reports some spotting on 1/11 - 1/13. Using nothing for [facility] at this time.

## 2012-06-12 NOTE — MAU Provider Note (Signed)
History     CSN: [id number]  Arrival date and time: 06/12/12 1443   First Provider Initiated Contact with Patient 06/12/12 1528      No chief complaint on file.  HPI Pt is not pregnant and presents with lower abdominal cramping for 2 weeks since LMP 12/29.  Pt's cramping is worse RLQ and radiates to lower back.  Pt also has difficulty feeling urge to void but no dysuria.  Pt denies nausea, vomiting,fever, chills, constipation or diarrhea. Pt has bleeding after masturbation.  Pt is using Nexplanon for contraception  Past Medical History  Diagnosis Date  . Heart palpitations   . Heart murmur     palpitations  . Asthma     as child, inhaler for a year  . Urinary tract infection   . Anxiety     Past Surgical History  Procedure Date  . No past surgeries   . Cesarean section 04/16/2011    Procedure: CESAREAN SECTION;  Surgeon: Roseanna Rainbow, MD;  Location: [facility];  Service: Gynecology;  Laterality: N/A;    Family History  Problem Relation Age of Onset  . Anesthesia problems Neg Hx   . Hypotension Neg Hx   . Malignant hyperthermia Neg Hx   . Pseudochol deficiency Neg Hx     History  Substance Use Topics  . Smoking status: Light Tobacco Smoker  . Smokeless tobacco: Never Used  . Alcohol Use: No    Allergies:  Allergies  Allergen Reactions  . Amoxicillin Hives  . Penicillins Hives    Pt is unsure if she is allergic to penicillin or amoxicillin    No prescriptions prior to admission    Review of Systems  Constitutional: Negative for fever and chills.  Gastrointestinal: Positive for abdominal pain. Negative for nausea, vomiting, diarrhea and constipation.  Genitourinary: Negative for dysuria, urgency and frequency.   Physical Exam   Blood pressure 123/85, pulse 97, temperature 98.1 F (36.7 C), temperature source Oral, resp. rate 16, height 5\' 1"  (1.549 m), weight 197 lb (89.359 kg), last menstrual period 05/22/2012, not currently  breastfeeding.  Physical Exam  Nursing note and vitals reviewed. Constitutional: She is oriented to person, place, and time. She appears well-developed and well-nourished.  HENT:  Head: Normocephalic.  Eyes: Pupils are equal, round, and reactive to light.  Neck: Normal range of motion. Neck supple.  Cardiovascular: Normal rate.   Respiratory: Effort normal.  GI: Soft. She exhibits no distension. There is tenderness. There is no rebound and no guarding.  Genitourinary:       Small amount of white vaginal discharge in vault; cervic clean NT; uterus NSSC NT; right adnexal tenderness noted- no rebound; left adnexa without palpable enlargement or tenderness  Neurological: She is alert and oriented to person, place, and time.  Skin: Skin is warm and dry.  Psychiatric: She has a normal mood and affect.    MAU Course  Procedures Pt declines pain medication Clinical Data: Pelvic pain for 2 weeks. LMP 05/22/2012.  TRANSABDOMINAL AND TRANSVAGINAL ULTRASOUND OF PELVIS  Technique: Both transabdominal and transvaginal ultrasound  examinations of the pelvis were performed. Transabdominal technique  was performed for global imaging of the pelvis including uterus,  ovaries, adnexal regions, and pelvic cul-de-sac.  Comparison: None  Findings:  Uterus: Uterus is anteverted and measures 8.4 x 3.6 x 4.7 cm.  Myometrium is homogeneous. No focal uterine mass.  Endometrium: Normal in thickness and appearance. Measures a  maximum of 13 mm in thickness.  Right ovary:  Measures 4.8 x 3.4 x 3.7 cm and contains a complex  cyst with thin internal lacy septations that measures 3.7 x 2.7 x  3.4 cm. This is likely a hemorrhagic cyst. Color Doppler flow is  seen to the surrounding ovarian parenchyma. No flow is identified  within the complex cyst. Measures 3.5 x 1.5 x 1.6 cm.  Left ovary: Normal appearance/no adnexal mass  Other findings: Trace amount of free pelvic fluid peri  IMPRESSION:  3.7 cm probable  hemorrhagic cyst right ovary. Short-interval follow  up ultrasound in 6-12 weeks is recommended, preferably during the  week following the patient's normal menses.  Original Report Authenticated By: Britta Mccreedy, M.D. Discussed with Dr. Tamela Oddi- will have pt follow up in the office next week. Assessment and Plan  Abdominal pain-NSAIDs for pain Right hemorrhagic cyst- recommend NSAID and f/u with Dr. Tamela Oddi this week  [facility] 06/12/2012, 4:39 PM

## 2012-06-13 LAB — GC/CHLAMYDIA PROBE AMP: GC Probe RNA: NEGATIVE

## 2012-07-04 ENCOUNTER — Emergency Department (HOSPITAL_COMMUNITY)
Admission: EM | Admit: 2012-07-04 | Discharge: 2012-07-04 | Disposition: A | Discharge status: Home / Self Care | Payer: Medicaid Other | Attending: Emergency Medicine | Admitting: Emergency Medicine

## 2012-07-04 ENCOUNTER — Encounter (HOSPITAL_COMMUNITY): Payer: Self-pay | Admitting: Emergency Medicine

## 2012-07-04 DIAGNOSIS — Z87440 Personal history of urinary (tract) infections: Secondary | ICD-10-CM | POA: Insufficient documentation

## 2012-07-04 DIAGNOSIS — R232 Flushing: Secondary | ICD-10-CM | POA: Insufficient documentation

## 2012-07-04 DIAGNOSIS — Z3202 Encounter for pregnancy test, result negative: Secondary | ICD-10-CM | POA: Insufficient documentation

## 2012-07-04 DIAGNOSIS — Z79899 Other long term (current) drug therapy: Secondary | ICD-10-CM | POA: Insufficient documentation

## 2012-07-04 DIAGNOSIS — F17200 Nicotine dependence, unspecified, uncomplicated: Secondary | ICD-10-CM | POA: Insufficient documentation

## 2012-07-04 DIAGNOSIS — R42 Dizziness and giddiness: Secondary | ICD-10-CM

## 2012-07-04 DIAGNOSIS — Z8709 Personal history of other diseases of the respiratory system: Secondary | ICD-10-CM | POA: Insufficient documentation

## 2012-07-04 DIAGNOSIS — R110 Nausea: Secondary | ICD-10-CM | POA: Insufficient documentation

## 2012-07-04 DIAGNOSIS — Z8659 Personal history of other mental and behavioral disorders: Secondary | ICD-10-CM | POA: Insufficient documentation

## 2012-07-04 DIAGNOSIS — Z8679 Personal history of other diseases of the circulatory system: Secondary | ICD-10-CM | POA: Insufficient documentation

## 2012-07-04 LAB — URINALYSIS, ROUTINE W REFLEX MICROSCOPIC
Bilirubin Urine: NEGATIVE
Hgb urine dipstick: NEGATIVE
Nitrite: NEGATIVE
Protein, ur: NEGATIVE mg/dL
Specific Gravity, Urine: 1.024 (ref 1.005–1.030)
Urobilinogen, UA: 0.2 mg/dL (ref 0.0–1.0)

## 2012-07-04 LAB — URINE MICROSCOPIC-ADD ON

## 2012-07-04 MED ORDER — ONDANSETRON HCL 4 MG PO TABS: 4.0000 mg | ORAL_TABLET | Freq: Three times a day (TID) | ORAL | Status: DC | PRN

## 2012-07-04 MED ORDER — MECLIZINE HCL 50 MG PO TABS: 50.0000 mg | ORAL_TABLET | Freq: Two times a day (BID) | ORAL | Status: DC

## 2012-07-04 NOTE — ED Provider Notes (Signed)
Medical screening examination/treatment/procedure(s) were performed by non-physician practitioner and as supervising physician I was immediately available for consultation/collaboration.  Marchel Foote R. Sherlock Nancarrow, MD 07/04/12 2011 

## 2012-07-04 NOTE — ED Notes (Signed)
Pt did not answer x 1 

## 2012-07-04 NOTE — ED Notes (Signed)
Last 4 days has woken up dizzylightheaded hot flashes nauseated lmp  !/28 just started bcp 6-7 days ago

## 2012-07-04 NOTE — ED Notes (Signed)
Pt did not answer.

## 2012-07-04 NOTE — ED Provider Notes (Signed)
History     CSN: [id number]  Arrival date & time 07/04/12  1450   First MD Initiated Contact with Patient 07/04/12 1741      No chief complaint on file.   (Consider location/radiation/quality/duration/timing/severity/associated sxs/prior treatment) HPI Comments: This is a 22 year old female, who presents to the emergency department with chief complaint of nausea, dizziness, and hot flashes. Patient states that she recently started taking a new birth control pill 6 days ago. Patient states that she takes her birth control pill in the evenings, and that her symptoms are worse in the morning, and gradually improves throughout the day. She states that her symptoms are mild at this time, but are always worse in the morning. She is concerned that it is because of her new birth control pill. Nothing makes her symptoms better or worse. She denies any pain at this time.  The history is provided by the patient. No language interpreter was used.    Past Medical History  Diagnosis Date  . Heart palpitations   . Heart murmur     palpitations  . Asthma     as child, inhaler for a year  . Urinary tract infection   . Anxiety     Past Surgical History  Procedure Laterality Date  . No past surgeries    . Cesarean section  04/16/2011    Procedure: CESAREAN SECTION;  Surgeon: Roseanna Rainbow, MD;  Location: [facility];  Service: Gynecology;  Laterality: N/A;    Family History  Problem Relation Age of Onset  . Anesthesia problems Neg Hx   . Hypotension Neg Hx   . Malignant hyperthermia Neg Hx   . Pseudochol deficiency Neg Hx     History  Substance Use Topics  . Smoking status: Light Tobacco Smoker  . Smokeless tobacco: Never Used  . Alcohol Use: No    OB History   Grav Para Term Preterm Abortions TAB SAB Ect Mult Living   1 1 1       1       Review of Systems  All other systems reviewed and are negative.    Allergies  Amoxicillin and Penicillins  Home Medications    Current Outpatient Rx  Name  Route  Sig  Dispense  Refill  . Multiple Vitamin (MULTIVITAMIN WITH MINERALS) TABS   Oral   Take 1 tablet by mouth daily. Gummy vitamins         . PRESCRIPTION MEDICATION   Oral   Take 1 tablet by mouth daily. BC pill           BP 133/70  Pulse 86  Temp(Src) 98.7 F (37.1 C) (Oral)  Resp 20  SpO2 99%  LMP 05/22/2012  Physical Exam  Nursing note and vitals reviewed. Constitutional: She is oriented to person, place, and time. She appears well-developed and well-nourished.  HENT:  Head: Normocephalic and atraumatic.  Eyes: Conjunctivae and EOM are normal. Pupils are equal, round, and reactive to light.  Neck: Normal range of motion. Neck supple.  Cardiovascular: Normal rate and regular rhythm.  Exam reveals no gallop and no friction rub.   No murmur heard. Pulmonary/Chest: Effort normal and breath sounds normal. No respiratory distress. She has no wheezes. She has no rales. She exhibits no tenderness.  Abdominal: Soft. Bowel sounds are normal. She exhibits no distension and no mass. There is no tenderness. There is no rebound and no guarding.  Musculoskeletal: Normal range of motion. She exhibits no edema and no tenderness.  Neurological: She is alert and oriented to person, place, and time.  CN 3-12 intact, no pronator drift, rapid alternating movement intact, heel to shin intact, gait is stable and balanced  Skin: Skin is warm and dry.  Psychiatric: She has a normal mood and affect. Her behavior is normal. Judgment and thought content normal.    ED Course  Procedures (including critical care time)  Labs Reviewed  URINALYSIS, ROUTINE W REFLEX MICROSCOPIC - Abnormal; Notable for the following:    APPearance HAZY (*)    Leukocytes, UA SMALL (*)    All other components within normal limits  URINE MICROSCOPIC-ADD ON - Abnormal; Notable for the following:    Squamous Epithelial / LPF MANY (*)    Bacteria, UA FEW (*)    All other  components within normal limits  URINE CULTURE  POCT PREGNANCY, URINE   No results found.   1. Dizziness   2. Nausea       MDM  This is a 22 year old female, who presents emergency department with nausea, dizziness, and hot flashes. Suspect that the patient's symptoms are from the patient's new birth control pills. Patient has largely asymptomatic on my exam. I'm going to discharge her to home with OB/GYN followup. I will give her Zofran and meclizine to go home with. Return precautions have been given. Patient understands and agrees with the plan. She is stable and ready for discharge.        Roxy Horseman, PA-C 07/04/12 [phone]

## 2012-07-04 NOTE — ED Notes (Addendum)
Pt reports dizziness that started Friday morning and has not gotten any better.  Pt denies fever reports pulsating headache in mornings. Describes feeling as if she were "hungover" in mornings. States pressure behind eyes and light hurts.  Pt denies injury  Pt alert oriented X4

## 2012-07-05 LAB — URINE CULTURE: Colony Count: 65000

## 2012-07-09 ENCOUNTER — Other Ambulatory Visit: Payer: Self-pay

## 2012-08-10 ENCOUNTER — Other Ambulatory Visit: Payer: Self-pay | Admitting: Obstetrics & Gynecology

## 2012-08-10 DIAGNOSIS — N83209 Unspecified ovarian cyst, unspecified side: Secondary | ICD-10-CM

## 2012-08-31 ENCOUNTER — Emergency Department (HOSPITAL_COMMUNITY)
Admission: EM | Admit: 2012-08-31 | Discharge: 2012-08-31 | Disposition: A | Discharge status: Home / Self Care | Payer: Medicaid Other | Attending: Emergency Medicine | Admitting: Emergency Medicine

## 2012-08-31 ENCOUNTER — Encounter (HOSPITAL_COMMUNITY): Payer: Self-pay | Admitting: Family Medicine

## 2012-08-31 DIAGNOSIS — Z87440 Personal history of urinary (tract) infections: Secondary | ICD-10-CM | POA: Insufficient documentation

## 2012-08-31 DIAGNOSIS — F17200 Nicotine dependence, unspecified, uncomplicated: Secondary | ICD-10-CM | POA: Insufficient documentation

## 2012-08-31 DIAGNOSIS — R112 Nausea with vomiting, unspecified: Secondary | ICD-10-CM | POA: Insufficient documentation

## 2012-08-31 DIAGNOSIS — Z8709 Personal history of other diseases of the respiratory system: Secondary | ICD-10-CM | POA: Insufficient documentation

## 2012-08-31 DIAGNOSIS — Z8659 Personal history of other mental and behavioral disorders: Secondary | ICD-10-CM | POA: Insufficient documentation

## 2012-08-31 DIAGNOSIS — Z8679 Personal history of other diseases of the circulatory system: Secondary | ICD-10-CM | POA: Insufficient documentation

## 2012-08-31 DIAGNOSIS — R51 Headache: Secondary | ICD-10-CM | POA: Insufficient documentation

## 2012-08-31 DIAGNOSIS — Z3202 Encounter for pregnancy test, result negative: Secondary | ICD-10-CM | POA: Insufficient documentation

## 2012-08-31 LAB — URINALYSIS, ROUTINE W REFLEX MICROSCOPIC
Glucose, UA: NEGATIVE mg/dL
Protein, ur: NEGATIVE mg/dL
Urobilinogen, UA: 1 mg/dL (ref 0.0–1.0)

## 2012-08-31 LAB — POCT PREGNANCY, URINE: Preg Test, Ur: NEGATIVE

## 2012-08-31 MED ORDER — KETOROLAC TROMETHAMINE 60 MG/2ML IM SOLN
60.0000 mg | Freq: Once | INTRAMUSCULAR | Status: AC
  Administered 2012-08-31: 60 mg via INTRAMUSCULAR
  Filled 2012-08-31: qty 2

## 2012-08-31 MED ORDER — METOCLOPRAMIDE HCL 5 MG/ML IJ SOLN
10.0000 mg | Freq: Once | INTRAMUSCULAR | Status: AC
  Administered 2012-08-31: 10 mg via INTRAMUSCULAR
  Filled 2012-08-31: qty 2

## 2012-08-31 MED ORDER — METOCLOPRAMIDE HCL 10 MG PO TABS: 10.0000 mg | ORAL_TABLET | Freq: Three times a day (TID) | ORAL | Status: DC | PRN

## 2012-08-31 MED ORDER — NAPROXEN 500 MG PO TABS: 500.0000 mg | ORAL_TABLET | Freq: Two times a day (BID) | ORAL | Status: DC

## 2012-08-31 NOTE — ED Notes (Signed)
Per pt sts Body ahces, N,V,D since yesterday. sts her little girl became sick at school yesterday. sts also HA.

## 2012-08-31 NOTE — ED Provider Notes (Signed)
History     CSN: [id number]  Arrival date & time 08/31/12  0919   First MD Initiated Contact with Patient 08/31/12 [phone]      Chief Complaint  Patient presents with  . Diarrhea  . Nausea  . Emesis    (Consider location/radiation/quality/duration/timing/severity/associated sxs/prior treatment) HPI Comments: 22 year old female with a history of headaches which are frequent in the past who presents with the complaint of body aches, loose stools for 24 hours and nausea. She states that she has relatively frequent headaches, they can last for hours to days, similar to the one that she is having at this time. She denies coughing, fevers, chills, dysuria, hematuria swelling or rashes. The symptoms are persistent, nothing seems to make it better, worse with bright lights. He does have a child but did have some gastrointestinal symptoms over the weekend but that was 5 days ago  Patient is a 22 y.o. female presenting with diarrhea and vomiting. The history is provided by the patient.  Diarrhea Associated symptoms: vomiting   Emesis Associated symptoms: diarrhea     Past Medical History  Diagnosis Date  . Heart palpitations   . Heart murmur     palpitations  . Asthma     as child, inhaler for a year  . Urinary tract infection   . Anxiety     Past Surgical History  Procedure Laterality Date  . No past surgeries    . Cesarean section  04/16/2011    Procedure: CESAREAN SECTION;  Surgeon: Roseanna Rainbow, MD;  Location: [facility];  Service: Gynecology;  Laterality: N/A;    Family History  Problem Relation Age of Onset  . Anesthesia problems Neg Hx   . Hypotension Neg Hx   . Malignant hyperthermia Neg Hx   . Pseudochol deficiency Neg Hx     History  Substance Use Topics  . Smoking status: Light Tobacco Smoker  . Smokeless tobacco: Never Used  . Alcohol Use: No    OB History   Grav Para Term Preterm Abortions TAB SAB Ect Mult Living   1 1 1       1       Review of  Systems  Gastrointestinal: Positive for vomiting and diarrhea.  All other systems reviewed and are negative.    Allergies  Amoxicillin and Penicillins  Home Medications   Current Outpatient Rx  Name  Route  Sig  Dispense  Refill  . Lactobacillus Rhamnosus, GG, (CULTURELLE PO)   Oral   Take 1 capsule by mouth daily.         . norgestimate-ethinyl estradiol (ORTHO-CYCLEN,SPRINTEC,PREVIFEM) 0.25-35 MG-MCG tablet   Oral   Take 1 tablet by mouth daily.         . metoCLOPramide (REGLAN) 10 MG tablet   Oral   Take 1 tablet (10 mg total) by mouth 3 (three) times daily as needed (headache / nausea).   20 tablet   0   . Multiple Vitamin (MULTIVITAMIN WITH MINERALS) TABS   Oral   Take 1 tablet by mouth daily. Gummy vitamins         . naproxen (NAPROSYN) 500 MG tablet   Oral   Take 1 tablet (500 mg total) by mouth 2 (two) times daily with a meal.   30 tablet   0     BP 136/88  Pulse 112  Temp(Src) 98.8 F (37.1 C) (Oral)  Resp 16  SpO2 100%  LMP 08/18/2012  Physical Exam  Nursing note and vitals  reviewed. Constitutional: She appears well-developed and well-nourished. No distress.  HENT:  Head: Normocephalic and atraumatic.  Mouth/Throat: Oropharynx is clear and moist. No oropharyngeal exudate.  Eyes: Conjunctivae and EOM are normal. Pupils are equal, round, and reactive to light. Right eye exhibits no discharge. Left eye exhibits no discharge. No scleral icterus.  Neck: Normal range of motion. Neck supple. No JVD present. No thyromegaly present.  Cardiovascular: Normal rate, regular rhythm, normal heart sounds and intact distal pulses.  Exam reveals no gallop and no friction rub.   No murmur heard. Pulmonary/Chest: Effort normal and breath sounds normal. No respiratory distress. She has no wheezes. She has no rales.  Abdominal: Soft. Bowel sounds are normal. She exhibits no distension and no mass. There is no tenderness.  Musculoskeletal: Normal range of motion.  She exhibits no edema and no tenderness.  Lymphadenopathy:    She has no cervical adenopathy.  Neurological: She is alert. Coordination normal.  The patient has normal speech, normal memory, normal coordination of all 4 extremities without any limb ataxia. The patient has normal strength of all 4 extremities at the major muscle groups including shoulders, biceps, triceps, forearm, grips, quadriceps, hamstrings, calves. Normal sensation to light touch and pinprick of all 4 extremities and the trunk. No truncal ataxia, normal gait, cranial nerves III through XII are intact.  Normal peripheral visual fields. Normal extraocular movements. Normal pupillary exam. No pronator drift, normal reflexes at the brachial radialis, biceps and patellar tendons. Normal position sense, normal temperature sensation to cold and warm  Skin: Skin is warm and dry. No rash noted. No erythema.  Psychiatric: She has a normal mood and affect. Her behavior is normal.    ED Course  Procedures (including critical care time)  Labs Reviewed  URINALYSIS, ROUTINE W REFLEX MICROSCOPIC - Abnormal; Notable for the following:    APPearance CLOUDY (*)    Bilirubin Urine SMALL (*)    Ketones, ur 15 (*)    Leukocytes, UA MODERATE (*)    All other components within normal limits  URINE MICROSCOPIC-ADD ON - Abnormal; Notable for the following:    Squamous Epithelial / LPF MANY (*)    Bacteria, UA FEW (*)    All other components within normal limits  URINE CULTURE  POCT PREGNANCY, URINE   No results found.   1. Headache       MDM  The patient is well-appearing, no fever, no tachycardia, no hypertension and no hypoxia. She is able to move her head from side to side without any difficulties, there is no stiff neck, no focal neurologic deficits and this seems to be more consistent with a headache illness associated with some nausea, myalgias may be related or may be related to a viral process but the patient appears very  nontoxic. The medications and nausea medications ordered.  Patient improved after parenteral Toradol and Reglan, has urinalysis and pregnancy which do not show any signs of infection or pregnancy. The patient appears stable for discharge, indications for return and given and understanding expressed  Vida Roller, MD 08/31/12 1308

## 2012-08-31 NOTE — ED Notes (Signed)
Pt states she feels much better. Pt states that the lower part of her legs ache and her stomach aches a little bit.

## 2012-09-01 LAB — URINE CULTURE: Colony Count: NO GROWTH

## 2012-09-12 ENCOUNTER — Encounter: Payer: Self-pay | Admitting: *Deleted

## 2012-09-13 ENCOUNTER — Ambulatory Visit (INDEPENDENT_AMBULATORY_CARE_PROVIDER_SITE_OTHER): Payer: Medicaid Other

## 2012-09-13 DIAGNOSIS — N83209 Unspecified ovarian cyst, unspecified side: Secondary | ICD-10-CM

## 2012-09-19 ENCOUNTER — Encounter: Payer: Self-pay | Admitting: Obstetrics & Gynecology

## 2012-09-19 ENCOUNTER — Ambulatory Visit (INDEPENDENT_AMBULATORY_CARE_PROVIDER_SITE_OTHER): Payer: Medicaid Other | Admitting: Obstetrics & Gynecology

## 2012-09-19 VITALS — BP 116/79 | HR 75 | Temp 98.2°F | Ht 62.0 in | Wt 191.0 lb

## 2012-09-19 DIAGNOSIS — N760 Acute vaginitis: Secondary | ICD-10-CM | POA: Insufficient documentation

## 2012-09-19 NOTE — Progress Notes (Signed)
.   Subjective:     Krista Meza is a 22 y.o. female here for her ultrasound results - done 09/13/12.  Personal health questionnaire reviewed: no.   Gynecologic History Patient's last menstrual period was 08/18/2012. Contraception: OCP (estrogen/progesterone) Last Pap: N/A Last mammogram: N/A  Obstetric History OB History   Grav Para Term Preterm Abortions TAB SAB Ect Mult Living   1 1 1       1      # Outc Date GA Lbr Len/2nd Wgt Sex Del Anes PTL Lv   1 TRM 11/12 [redacted]w[redacted]d 15:35 / 05:35 6lb14.1oz(3.12kg) F LVCS EPI  Yes       The following portions of the patient's history were reviewed and updated as appropriate: allergies, current medications, past family history, past medical history, past social history, past surgical history and problem list.  Review of Systems Pertinent items are noted in HPI.    Objective:   No exam today  Assessment:    Functional cyst resolved C/O chronic vulva irritation   Plan:    Return in a few weeks

## 2012-09-19 NOTE — Patient Instructions (Signed)
Vaginitis  Vaginitis is an infection. It causes soreness, swelling, and redness (inflammation) of the vagina. Many of these infections are sexually transmitted diseases (STDs). Having unprotected sex can cause further problems and complications such as:   Chronic pelvic pain.   Infertility.   Unwanted pregnancy.   Abortion.   Tubal pregnancy.   Infection passed on to the newborn.   Cancer.  CAUSES    Monilia. This is a yeast or fungus infection, not an STD.   Bacterial vaginosis. The normal balance of bacteria in the vagina is disrupted and is replaced by an overgrowth of certain bacteria.   Gonorrhea, chlamydia. These are bacterial infections that are STDs.   Vaginal sponges, diaphragms, and intrauterine devices.   Trichomoniasis. This is a STD infection caused by a parasite.   Viruses like herpes and human papillomavirus. Both are STDs.   Pregnancy.   Immunosuppression. This occurs with certain conditions such as HIV infection or cancer.   Using bubble bath.   Taking certain antibiotic medicines.   Sporadic recurrence can occur if you become sick.   Diabetes.   Steroids.   Allergic reaction. If you have an allergy to:   Douches.   Soaps.   Spermicides.   Condoms.   Scented tampons or vaginal sprays.  SYMPTOMS    Abnormal vaginal discharge.   Itching of the vagina.   Pain in the vagina.   Swelling of the vagina.  In some cases, there are no symptoms.  TREATMENT   Treatment will vary depending on the type of infection.   Bacteria or trichomonas are usually treated with oral antibiotics and sometimes vaginal cream or suppositories.   Monilia vaginitis is usually treated with vaginal creams, suppositories, or oral antifungal pills.   Viral vaginitis has no cure. However, the symptoms of herpes (a viral vaginitis) can be treated to relieve the discomfort. Human papillomavirus has no symptoms. However, there are treatments for the diseases caused by human papillomavirus.   With allergic  vaginitis, you need to stop using the product that is causing the problem. Vaginal creams can be used to treat the symptoms.   When treating an STD, the sex partner should also be treated.  HOME CARE INSTRUCTIONS    Take all the medicines as directed by your caregiver.   Do not use scented tampons, soaps, or vaginal sprays.   Do not douche.   Tell your sex partner if you have a vaginal infection or an STD.   Do not have sexual intercourse until you have treated the vaginitis.   Practice safe sex by using condoms.  SEEK MEDICAL CARE IF:    You have abdominal pain.   Your symptoms get worse during treatment.  Document Released: 03/08/2007 Document Revised: 08/03/2011 Document Reviewed: 11/01/2008  [employer] Patient Information 2013 [employer].

## 2012-09-26 ENCOUNTER — Ambulatory Visit: Payer: Medicaid Other | Admitting: Neurology

## 2012-10-06 ENCOUNTER — Ambulatory Visit: Payer: Self-pay | Admitting: Obstetrics & Gynecology

## 2012-10-06 ENCOUNTER — Ambulatory Visit: Payer: Medicaid Other | Admitting: Obstetrics & Gynecology

## 2013-03-30 ENCOUNTER — Other Ambulatory Visit: Payer: Self-pay

## 2013-05-08 ENCOUNTER — Ambulatory Visit (INDEPENDENT_AMBULATORY_CARE_PROVIDER_SITE_OTHER): Payer: Medicaid Other | Admitting: Obstetrics & Gynecology

## 2013-05-08 ENCOUNTER — Encounter: Payer: Medicaid Other | Admitting: Obstetrics & Gynecology

## 2013-05-08 ENCOUNTER — Encounter: Payer: Self-pay | Admitting: Obstetrics & Gynecology

## 2013-05-08 VITALS — BP 113/74 | Temp 97.8°F | Wt 190.0 lb

## 2013-05-08 DIAGNOSIS — Z3480 Encounter for supervision of other normal pregnancy, unspecified trimester: Secondary | ICD-10-CM | POA: Insufficient documentation

## 2013-05-08 DIAGNOSIS — O09891 Supervision of other high risk pregnancies, first trimester: Secondary | ICD-10-CM

## 2013-05-08 DIAGNOSIS — Z3201 Encounter for pregnancy test, result positive: Secondary | ICD-10-CM

## 2013-05-08 DIAGNOSIS — Z98891 History of uterine scar from previous surgery: Secondary | ICD-10-CM

## 2013-05-08 DIAGNOSIS — O09899 Supervision of other high risk pregnancies, unspecified trimester: Secondary | ICD-10-CM

## 2013-05-08 DIAGNOSIS — Z3481 Encounter for supervision of other normal pregnancy, first trimester: Secondary | ICD-10-CM

## 2013-05-08 LAB — POCT URINALYSIS DIPSTICK
Bilirubin, UA: NEGATIVE
Ketones, UA: NEGATIVE
Spec Grav, UA: 1.02

## 2013-05-08 NOTE — Progress Notes (Signed)
Pulse- 76  Subjective:    Krista Meza is being seen today for her first obstetrical visit.  This is not a planned pregnancy. She is at [redacted]w[redacted]d gestation. Her obstetrical history is significant for none. Relationship with FOB: spouse, not living together. Patient does intend to breast feed. Pregnancy history fully reviewed.  Menstrual History: OB History   Grav Para Term Preterm Abortions TAB SAB Ect Mult Living   2 1 1       1       Last Pap: 2014 Abnormal Menarche age: 22 Patient's last menstrual period was 03/19/2013.       Review of Systems Pertinent items are noted in HPI.    Objective:      General Appearance:    Alert, cooperative, no distress, appears stated age  Head:    Normocephalic, without obvious abnormality, atraumatic  Eyes:    PERRL, conjunctiva/corneas clear, EOM's intact, fundi    benign, both eyes  Ears:    Normal TM's and external ear canals, both ears  Nose:   Nares normal, septum midline, mucosa normal, no drainage    or sinus tenderness  Throat:   Lips, mucosa, and tongue normal; teeth and gums normal  Neck:   Supple, symmetrical, trachea midline, no adenopathy;    thyroid:  no enlargement/tenderness/nodules; no carotid   bruit or JVD  Back:     Symmetric, no curvature, ROM normal, no CVA tenderness  Lungs:     Clear to auscultation bilaterally, respirations unlabored  Chest Wall:    No tenderness or deformity   Heart:    Regular rate and rhythm, S1 and S2 normal, no murmur, rub   or gallop  Breast Exam:    No tenderness, masses, or nipple abnormality  Abdomen:     Soft, non-tender, bowel sounds active all four quadrants,    no masses, no organomegaly  Genitalia:    Normal female without lesion, discharge or tenderness  Extremities:   Extremities normal, atraumatic, no cyanosis or edema  Pulses:   2+ and symmetric all extremities  Skin:   Skin color, texture, turgor normal, no rashes or lesions  Lymph nodes:   Cervical, supraclavicular, and  axillary nodes normal  Neurologic:   CNII-XII intact, normal strength, sensation and reflexes    throughout     Informal U/S: FP w/ CRL [redacted]w[redacted]d; cardiac activity present   Assessment:    Pregnancy at [redacted]w[redacted]d weeks    Plan:    Initial labs drawn. Prenatal vitamins.  Counseling provided regarding continued use of seat belts, cessation of alcohol consumption, smoking or use of illicit drugs; infection precautions i.e., influenza/TDAP immunizations, toxoplasmosis,CMV, parvovirus, listeria and varicella; workplace safety, exercise during pregnancy; routine dental care, safe medications, sexual activity, hot tubs, saunas, pools, travel, caffeine use, fish and methlymercury, potential toxins, hair treatments, varicose veins Weight gain recommendations per IOM guidelines reviewed: obese/BMI >30->gain  11 - 20 lbs Problem list reviewed and updated. FIRST/CF mutation testing discussed Role of ultrasound in pregnancy discussed Amniocentesis discussed: not indicated. Follow up in 6 weeks. 50% of 20 min visit spent on counseling and coordination of care.

## 2013-05-09 LAB — OBSTETRIC PANEL
Basophils Absolute: 0 10*3/uL (ref 0.0–0.1)
Lymphocytes Relative: 35 % (ref 12–46)
Neutro Abs: 3.9 10*3/uL (ref 1.7–7.7)
Platelets: 385 10*3/uL (ref 150–400)
RDW: 13.8 % (ref 11.5–15.5)
Rubella: 1.14 Index — ABNORMAL HIGH (ref <0.90)
WBC: 7.1 10*3/uL (ref 4.0–10.5)

## 2013-05-09 LAB — VARICELLA ZOSTER ANTIBODY, IGG: Varicella IgG: 65.83 Index (ref <135.00)

## 2013-05-09 LAB — GC/CHLAMYDIA PROBE AMP
CT Probe RNA: NEGATIVE
GC Probe RNA: NEGATIVE

## 2013-05-09 LAB — HIV ANTIBODY (ROUTINE TESTING W REFLEX): HIV: NONREACTIVE

## 2013-05-09 LAB — VITAMIN D 25 HYDROXY (VIT D DEFICIENCY, FRACTURES): Vit D, 25-Hydroxy: 20 ng/mL — ABNORMAL LOW (ref 30–89)

## 2013-05-09 NOTE — Patient Instructions (Signed)
Pregnancy - First Trimester  During sexual intercourse, millions of sperm go into the vagina. Only 1 sperm will penetrate and fertilize the female egg while it is in the Fallopian tube. One week later, the fertilized egg implants into the wall of the uterus. An embryo begins to develop into a baby. At 6 to 8 weeks, the eyes and face are formed and the heartbeat can be seen on ultrasound. At the end of 12 weeks (first trimester), all the baby's organs are formed. Now that you are pregnant, you will want to do everything you can to have a healthy baby. Two of the most important things are to get good prenatal care and follow your caregiver's instructions. Prenatal care is all the medical care you receive before the baby's birth. It is given to prevent, find, and treat problems during the pregnancy and childbirth.  PRENATAL EXAMS  · During prenatal visits, your weight, blood pressure, and urine are checked. This is done to make sure you are healthy and progressing normally during the pregnancy.  · A pregnant woman should gain 25 to 35 pounds during the pregnancy. However, if you are overweight or underweight, your caregiver will advise you regarding your weight.  · Your caregiver will ask and answer questions for you.  · Blood work, cervical cultures, other necessary tests, and a Pap test are done during your prenatal exams. These tests are done to check on your health and the probable health of your baby. Tests are strongly recommended and done for HIV with your permission. This is the virus that causes AIDS. These tests are done because medicines can be given to help prevent your baby from being born with this infection should you have been infected without knowing it. Blood work is also used to find out your blood type, previous infections, and follow your blood levels (hemoglobin).  · Low hemoglobin (anemia) is common during pregnancy. Iron and vitamins are given to help prevent this. Later in the pregnancy, blood  tests for diabetes will be done along with any other tests if any problems develop.  · You may need other tests to make sure you and the baby are doing well.  CHANGES DURING THE FIRST TRIMESTER   Your body goes through many changes during pregnancy. They vary from person to person. Talk to your caregiver about changes you notice and are concerned about. Changes can include:  · Your menstrual period stops.  · The egg and sperm carry the genes that determine what you look like. Genes from you and your partner are forming a baby. The female genes determine whether the baby is a boy or a girl.  · Your body increases in girth and you may feel bloated.  · Feeling sick to your stomach (nauseous) and throwing up (vomiting). If the vomiting is uncontrollable, call your caregiver.  · Your breasts will begin to enlarge and become tender.  · Your nipples may stick out more and become darker.  · The need to urinate more. Painful urination may mean you have a bladder infection.  · Tiring easily.  · Loss of appetite.  · Cravings for certain kinds of food.  · At first, you may gain or lose a couple of pounds.  · You may have changes in your emotions from day to day (excited to be pregnant or concerned something may go wrong with the pregnancy and baby).  · You may have more vivid and strange dreams.  HOME CARE INSTRUCTIONS   ·   It is very important to avoid all smoking, alcohol and non-prescribed drugs during your pregnancy. These affect the formation and growth of the baby. Avoid chemicals while pregnant to ensure the delivery of a healthy infant.  · Start your prenatal visits by the 12th week of pregnancy. They are usually scheduled monthly at first, then more often in the last 2 months before delivery. Keep your caregiver's appointments. Follow your caregiver's instructions regarding medicine use, blood and lab tests, exercise, and diet.  · During pregnancy, you are providing food for you and your baby. Eat regular, well-balanced  meals. Choose foods such as meat, fish, milk and other low fat dairy products, vegetables, fruits, and whole-grain breads and cereals. Your caregiver will tell you of the ideal weight gain.  · You can help morning sickness by keeping soda crackers at the bedside. Eat a couple before arising in the morning. You may want to use the crackers without salt on them.  · Eating 4 to 5 small meals rather than 3 large meals a day also may help the nausea and vomiting.  · Drinking liquids between meals instead of during meals also seems to help nausea and vomiting.  · A physical sexual relationship may be continued throughout pregnancy if there are no other problems. Problems may be early (premature) leaking of amniotic fluid from the membranes, vaginal bleeding, or belly (abdominal) pain.  · Exercise regularly if there are no restrictions. Check with your caregiver or physical therapist if you are unsure of the safety of some of your exercises. Greater weight gain will occur in the last 2 trimesters of pregnancy. Exercising will help:  · Control your weight.  · Keep you in shape.  · Prepare you for labor and delivery.  · Help you lose your pregnancy weight after you deliver your baby.  · Wear a good support or jogging bra for breast tenderness during pregnancy. This may help if worn during sleep too.  · Ask when prenatal classes are available. Begin classes when they are offered.  · Do not use hot tubs, steam rooms, or saunas.  · Wear your seat belt when driving. This protects you and your baby if you are in an accident.  · Avoid raw meat, uncooked cheese, cat litter boxes, and soil used by cats throughout the pregnancy. These carry germs that can cause birth defects in the baby.  · The first trimester is a good time to visit your dentist for your dental health. Getting your teeth cleaned is okay. Use a softer toothbrush and brush gently during pregnancy.  · Ask for help if you have financial, counseling, or nutritional needs  during pregnancy. Your caregiver will be able to offer counseling for these needs as well as refer you for other special needs.  · Do not take any medicines or herbs unless told by your caregiver.  · Inform your caregiver if there is any mental or physical domestic violence.  · Make a list of emergency phone numbers of family, friends, hospital, and police and fire departments.  · Write down your questions. Take them to your prenatal visit.  · Do not douche.  · Do not cross your legs.  · If you have to stand for long periods of time, rotate you feet or take small steps in a circle.  · You may have more vaginal secretions that may require a sanitary pad. Do not use tampons or scented sanitary pads.  MEDICINES AND DRUG USE IN PREGNANCY  ·   Take prenatal vitamins as directed. The vitamin should contain 1 milligram of folic acid. Keep all vitamins out of reach of children. Only a couple vitamins or tablets containing iron may be fatal to a baby or young child when ingested.  · Avoid use of all medicines, including herbs, over-the-counter medicines, not prescribed or suggested by your caregiver. Only take over-the-counter or prescription medicines for pain, discomfort, or fever as directed by your caregiver. Do not use aspirin, ibuprofen, or naproxen unless directed by your caregiver.  · Let your caregiver also know about herbs you may be using.  · Alcohol is related to a number of birth defects. This includes fetal alcohol syndrome. All alcohol, in any form, should be avoided completely. Smoking will cause low birth rate and premature babies.  · Street or illegal drugs are very harmful to the baby. They are absolutely forbidden. A baby born to an addicted mother will be addicted at birth. The baby will go through the same withdrawal an adult does.  · Let your caregiver know about any medicines that you have to take and for what reason you take them.  SEEK MEDICAL CARE IF:   You have any concerns or worries during your  pregnancy. It is better to call with your questions if you feel they cannot wait, rather than worry about them.  SEEK IMMEDIATE MEDICAL CARE IF:   · An unexplained oral temperature above 102° F (38.9° C) develops, or as your caregiver suggests.  · You have leaking of fluid from the vagina (birth canal). If leaking membranes are suspected, take your temperature and inform your caregiver of this when you call.  · There is vaginal spotting or bleeding. Notify your caregiver of the amount and how many pads are used.  · You develop a bad smelling vaginal discharge with a change in the color.  · You continue to feel sick to your stomach (nauseated) and have no relief from remedies suggested. You vomit blood or coffee ground-like materials.  · You lose more than 2 pounds of weight in 1 week.  · You gain more than 2 pounds of weight in 1 week and you notice swelling of your face, hands, feet, or legs.  · You gain 5 pounds or more in 1 week (even if you do not have swelling of your hands, face, legs, or feet).  · You get exposed to German measles and have never had them.  · You are exposed to fifth disease or chickenpox.  · You develop belly (abdominal) pain. Round ligament discomfort is a common non-cancerous (benign) cause of abdominal pain in pregnancy. Your caregiver still must evaluate this.  · You develop headache, fever, diarrhea, pain with urination, or shortness of breath.  · You fall or are in a car accident or have any kind of trauma.  · There is mental or physical violence in your home.  Document Released: 05/05/2001 Document Revised: 02/03/2012 Document Reviewed: 11/06/2008  [employer]® Patient Information ©2014 [employer].

## 2013-05-10 LAB — HEMOGLOBINOPATHY EVALUATION
Hgb F Quant: 0 % (ref 0.0–2.0)
Hgb S Quant: 0 %

## 2013-05-16 ENCOUNTER — Other Ambulatory Visit (INDEPENDENT_AMBULATORY_CARE_PROVIDER_SITE_OTHER): Payer: Medicaid Other

## 2013-05-16 ENCOUNTER — Encounter: Payer: Self-pay | Admitting: Obstetrics

## 2013-05-16 DIAGNOSIS — O3680X0 Pregnancy with inconclusive fetal viability, not applicable or unspecified: Secondary | ICD-10-CM

## 2013-05-16 DIAGNOSIS — O3680X1 Pregnancy with inconclusive fetal viability, fetus 1: Secondary | ICD-10-CM

## 2013-05-16 LAB — US OB DETAIL + 14 WK

## 2013-05-22 ENCOUNTER — Encounter: Payer: Self-pay | Admitting: Obstetrics & Gynecology

## 2013-05-22 LAB — US OB DETAIL + 14 WK

## 2013-05-25 NOTE — L&D Delivery Note (Signed)
Delivery Note At 2:36 AM a viable female was delivered via VBAC, Spontaneous (Presentation: ; Occiput Anterior).  APGAR: 8, 9; weight .   Placenta status: Intact, Spontaneous.  Cord: 3 vessels with the following complications: None.  Cord pH: not done  Anesthesia: Epidural  Episiotomy: None Lacerations: Periurethral Suture Repair: 2.0 vicryl Est. Blood Loss (mL):   Mom to postpartum.  Baby to Couplet care / Skin to Skin.  Tyan Lasure A 12/23/2013, 2:57 AM

## 2013-05-26 ENCOUNTER — Other Ambulatory Visit: Payer: Self-pay | Admitting: *Deleted

## 2013-05-26 DIAGNOSIS — E559 Vitamin D deficiency, unspecified: Secondary | ICD-10-CM

## 2013-05-26 MED ORDER — OB COMPLETE PETITE 35-5-1-200 MG PO CAPS: 1.0000 | ORAL_CAPSULE | Freq: Every day | ORAL | Status: DC

## 2013-06-19 ENCOUNTER — Ambulatory Visit (INDEPENDENT_AMBULATORY_CARE_PROVIDER_SITE_OTHER): Payer: Medicaid Other | Admitting: Obstetrics & Gynecology

## 2013-06-19 VITALS — BP 112/75 | Temp 97.9°F | Wt 184.5 lb

## 2013-06-19 DIAGNOSIS — Z3480 Encounter for supervision of other normal pregnancy, unspecified trimester: Secondary | ICD-10-CM

## 2013-06-19 LAB — POCT URINALYSIS DIPSTICK
Bilirubin, UA: NEGATIVE
Blood, UA: NEGATIVE
Glucose, UA: NEGATIVE
Ketones, UA: NEGATIVE
NITRITE UA: NEGATIVE
PH UA: 5
Spec Grav, UA: 1.025
UROBILINOGEN UA: NEGATIVE

## 2013-06-19 NOTE — Patient Instructions (Signed)
AFP Maternal This is a routine screen (tests) used to check for fetal abnormalities such as Down syndrome and neural tube defects. Down Syndrome is a chromosomal abnormality, sometimes called Trisomy 21. Neural tube defects are serious birth defects. The brain, spinal cord, or their coverings do not develop completely. Women should be tested in the 15th to 20th week of pregnancy. The msAFP screen involves three or four tests that measure substances found in the blood that make the testing better. During development, AFP levels in fetal blood and amniotic fluid rise until about 12 weeks. The levels then gradually fall until birth. AFP is a protein produce by fetal tissue. AFP crosses the placenta and appears in the maternal blood. A baby with an open neural tube defect has an opening in its spine, head, or abdominal wall that allows higher-than-usual amounts of AFP to pass into the mother's blood. If a screen is positive, more tests are needed to make a diagnosis. These include ultrasound and perhaps amniocentesis (checking the fluid that surrounds the baby). These tests are used to help women and their caregivers make decisions about the management of their pregnancies. In pregnancies where the fetus is carrying the chromosomal defect that results in Down syndrome, the levels of AFP and unconjugated estriol tend to be low and hCG and inhibin A levels high.  PREPARATION FOR TEST Blood is drawn from a vein in your arm usually between the 15th and 20th weeks of pregnancy. Four different tests on your blood are done. These are AFP, hCG, unconjugated estriol, and inhibin A. The combination of tests produces a more accurate result. NORMAL FINDINGS   Adult: less than 40ng/mL or less than 40 mg/L (SI units)  Child younger than1 year: less than 30 ng/mL Ranges are stratified by weeks of gestation and vary among laboratories. Ranges for normal findings may vary among different laboratories and hospitals. You  should always check with your doctor after having lab work or other tests done to discuss the meaning of your test results and whether your values are considered within normal limits. MEANING OF TEST  These are screening tests. Not all fetal abnormalities will give positive test results. Of all women who have positive AFP screening results, only a very small number of them have babies who actually have a neural tube defect or chromosomal abnormality. Your caregiver will go over the test results with you and discuss the importance and meaning of your results, as well as treatment options and the need for additional tests if necessary. OBTAINING THE TEST RESULTS It is your responsibility to obtain your test results. Ask the lab or department performing the test when and how you will get your results. Document Released: 06/02/2004 Document Revised: 08/03/2011 Document Reviewed: 04/14/2008 Exit[employer] Patient Information 2014 [employer].  

## 2013-06-19 NOTE — Progress Notes (Signed)
HR - 98 Pt in office today for routine OB visit, reports sharp pain to pelvic area radiates to sides bilateral, reports frequent cramping, states she has had decreased appetite has improved a little bit lately.

## 2013-07-17 ENCOUNTER — Encounter: Payer: Self-pay | Admitting: Advanced Practice Midwife

## 2013-07-17 ENCOUNTER — Ambulatory Visit (INDEPENDENT_AMBULATORY_CARE_PROVIDER_SITE_OTHER): Payer: Medicaid Other | Admitting: Advanced Practice Midwife

## 2013-07-17 VITALS — BP 115/73 | Temp 98.5°F | Wt 183.0 lb

## 2013-07-17 DIAGNOSIS — B379 Candidiasis, unspecified: Secondary | ICD-10-CM

## 2013-07-17 DIAGNOSIS — Z3480 Encounter for supervision of other normal pregnancy, unspecified trimester: Secondary | ICD-10-CM

## 2013-07-17 DIAGNOSIS — Z98891 History of uterine scar from previous surgery: Secondary | ICD-10-CM

## 2013-07-17 LAB — POCT URINALYSIS DIPSTICK
BILIRUBIN UA: NEGATIVE
Glucose, UA: NEGATIVE
Ketones, UA: NEGATIVE
Leukocytes, UA: NEGATIVE
NITRITE UA: NEGATIVE
PH UA: 6
PROTEIN UA: NEGATIVE
RBC UA: NEGATIVE
Spec Grav, UA: <=1.005
UROBILINOGEN UA: NEGATIVE

## 2013-07-17 MED ORDER — FLUCONAZOLE 150 MG PO TABS: 150.0000 mg | ORAL_TABLET | Freq: Once | ORAL | Status: DC

## 2013-07-17 NOTE — Progress Notes (Signed)
Pulse 87 Pt states that she is having some cramps with a lot of pressure in lower pelvic area.  Pt states that she is to have quad screen today. Pt would also like to be scheduled for u/s.  Pt states that she may have a UTI or yeast infection. Pt is having vaginal itching and irritation.

## 2013-07-17 NOTE — Progress Notes (Signed)
Subjective: Peggyann ShoalsKhalilah Wadhwa is a 23 y.o. at 17 weeks by LMP  Patient denies vaginal leaking of fluid or bleeding, denies contractions.  Reports positive fetal movment.  Denies concerns today.  Objective: Filed Vitals:   07/17/13 0919  BP: 115/73  Temp: 98.5 F (36.9 C)   150 FHR Below U Fundal Height Fetal Position NA  Assessment: Patient Active Problem List   Diagnosis Date Noted  . Previous cesarean section 05/08/2013  . Supervision of other normal pregnancy 05/08/2013  . Vaginitis and vulvovaginitis, unspecified 09/19/2012    Plan: Patient to return to clinic in 4 weeks VBAC success rate 34.4%, please review w/ patient NV. Gave VBAC paperwork.  Discussed comfort measures in pregnancy. Reviewed warning signs in pregnancy. Patient to call with concerns PRN. Reviewed triage location. Scheduled US today.  20 min spent with patient greater than 80% spent in counseling and coordination of care.  Elaynah Virginia Wilson SingerWren CNM

## 2013-07-18 LAB — AFP, QUAD SCREEN
AFP: 34.7 IU/mL
CURR GEST AGE: 16.6 wks.days
Down Syndrome Scr Risk Est: <1:38500 {titer}
HCG, Total: 13393 m[IU]/mL
INH: 150.2 pg/mL
Interpretation-AFP: NEGATIVE
MOM FOR AFP: 1.04
MOM FOR HCG: 0.67
MoM for INH: 0.91
Open Spina bifida: NEGATIVE
Osb Risk: 1:23400 {titer}
TRI 18 SCR RISK EST: NEGATIVE
uE3 Mom: 1.33
uE3 Value: 0.7 ng/mL

## 2013-07-25 ENCOUNTER — Encounter: Payer: Self-pay | Admitting: Obstetrics & Gynecology

## 2013-08-01 ENCOUNTER — Other Ambulatory Visit: Payer: Self-pay | Admitting: Obstetrics & Gynecology

## 2013-08-01 ENCOUNTER — Encounter: Payer: Self-pay | Admitting: Obstetrics & Gynecology

## 2013-08-01 ENCOUNTER — Ambulatory Visit (INDEPENDENT_AMBULATORY_CARE_PROVIDER_SITE_OTHER): Payer: Medicaid Other

## 2013-08-01 DIAGNOSIS — O365990 Maternal care for other known or suspected poor fetal growth, unspecified trimester, not applicable or unspecified: Secondary | ICD-10-CM

## 2013-08-01 DIAGNOSIS — Z1389 Encounter for screening for other disorder: Secondary | ICD-10-CM

## 2013-08-02 ENCOUNTER — Encounter: Payer: Self-pay | Admitting: Obstetrics & Gynecology

## 2013-08-02 LAB — US OB DETAIL + 14 WK

## 2013-08-08 ENCOUNTER — Encounter: Payer: Self-pay | Admitting: Obstetrics & Gynecology

## 2013-08-08 ENCOUNTER — Encounter: Payer: Self-pay | Admitting: Obstetrics

## 2013-08-08 ENCOUNTER — Ambulatory Visit (INDEPENDENT_AMBULATORY_CARE_PROVIDER_SITE_OTHER): Payer: Medicaid Other | Admitting: Obstetrics

## 2013-08-08 VITALS — BP 107/71 | Temp 98.1°F | Wt 182.0 lb

## 2013-08-08 DIAGNOSIS — Z3480 Encounter for supervision of other normal pregnancy, unspecified trimester: Secondary | ICD-10-CM

## 2013-08-08 LAB — POCT URINALYSIS DIPSTICK
BILIRUBIN UA: NEGATIVE
GLUCOSE UA: NEGATIVE
Ketones, UA: NEGATIVE
Nitrite, UA: NEGATIVE
SPEC GRAV UA: 1.025
UROBILINOGEN UA: NEGATIVE
pH, UA: 5

## 2013-08-08 NOTE — Progress Notes (Signed)
Pulse: 92 Patient states she has a tingly feeling in her abdomen and back. Patient states she woke up this morning feeling like she got into a fight or a car accident. Patient states her back felt bruised. Patient states she hasn't felt anything for a good 2 weeks now. Patient states she had an ultrasound on the 10th and could see her kicking but couldn't feel anything.

## 2013-08-12 ENCOUNTER — Encounter (HOSPITAL_COMMUNITY): Payer: Self-pay | Admitting: *Deleted

## 2013-08-12 ENCOUNTER — Inpatient Hospital Stay (HOSPITAL_COMMUNITY)
Admission: AD | Admit: 2013-08-12 | Discharge: 2013-08-12 | Disposition: A | Discharge status: Home / Self Care | Payer: Medicaid Other | Source: Ambulatory Visit | Attending: Obstetrics & Gynecology | Admitting: Obstetrics & Gynecology

## 2013-08-12 DIAGNOSIS — R110 Nausea: Secondary | ICD-10-CM

## 2013-08-12 DIAGNOSIS — R002 Palpitations: Secondary | ICD-10-CM | POA: Insufficient documentation

## 2013-08-12 DIAGNOSIS — O2390 Unspecified genitourinary tract infection in pregnancy, unspecified trimester: Secondary | ICD-10-CM | POA: Insufficient documentation

## 2013-08-12 DIAGNOSIS — N390 Urinary tract infection, site not specified: Secondary | ICD-10-CM | POA: Insufficient documentation

## 2013-08-12 DIAGNOSIS — R42 Dizziness and giddiness: Secondary | ICD-10-CM | POA: Insufficient documentation

## 2013-08-12 DIAGNOSIS — R109 Unspecified abdominal pain: Secondary | ICD-10-CM | POA: Insufficient documentation

## 2013-08-12 DIAGNOSIS — R000 Tachycardia, unspecified: Secondary | ICD-10-CM | POA: Insufficient documentation

## 2013-08-12 DIAGNOSIS — O2340 Unspecified infection of urinary tract in pregnancy, unspecified trimester: Secondary | ICD-10-CM

## 2013-08-12 DIAGNOSIS — Z87891 Personal history of nicotine dependence: Secondary | ICD-10-CM | POA: Insufficient documentation

## 2013-08-12 LAB — URINE MICROSCOPIC-ADD ON

## 2013-08-12 LAB — URINALYSIS, ROUTINE W REFLEX MICROSCOPIC
Bilirubin Urine: NEGATIVE
GLUCOSE, UA: NEGATIVE mg/dL
Ketones, ur: >80 mg/dL — AB
Nitrite: NEGATIVE
Protein, ur: 30 mg/dL — AB
Urobilinogen, UA: 0.2 mg/dL (ref 0.0–1.0)
pH: 6 (ref 5.0–8.0)

## 2013-08-12 MED ORDER — ONDANSETRON 8 MG PO TBDP: 8.0000 mg | ORAL_TABLET | Freq: Three times a day (TID) | ORAL | Status: DC | PRN

## 2013-08-12 MED ORDER — LACTATED RINGERS IV SOLN
Freq: Once | INTRAVENOUS | Status: AC
  Administered 2013-08-12: 12:00:00 via INTRAVENOUS

## 2013-08-12 MED ORDER — ONDANSETRON 8 MG PO TBDP
8.0000 mg | ORAL_TABLET | Freq: Once | ORAL | Status: AC
Start: 2013-08-12 — End: 2013-08-12
  Administered 2013-08-12: 8 mg via ORAL
  Filled 2013-08-12: qty 1

## 2013-08-12 MED ORDER — NITROFURANTOIN MONOHYD MACRO 100 MG PO CAPS: 100.0000 mg | ORAL_CAPSULE | Freq: Two times a day (BID) | ORAL | Status: DC

## 2013-08-12 NOTE — MAU Provider Note (Signed)
History     CSN: [id number]  Arrival date and time: 08/12/13 1016   First Provider Initiated Contact with Patient 08/12/13 1132      Chief Complaint  Patient presents with  . Dizziness  . Abdominal Pain  . Tachycardia   HPI Krista Meza 23 y.o. 922w6d   Comes to MAU not feeling well.  She drove to the hospital.  Has not eaten in 24 hours as she has had no appetite.  Has nausea but no vomiting.  Has noticed palpitations in the past few days.  Has a known history of palpitations and saw a cardiologist prior to any pregnancies.  Was on medication for palpitations but stopped it as the palpitations improved.  Client states that during pregnancy the palpitations worsen.  Her father also has palpitations and panic attacks.    OB History   Grav Para Term Preterm Abortions TAB SAB Ect Mult Living   2 1 1       1       Past Medical History  Diagnosis Date  . Heart palpitations   . Heart murmur     palpitations  . Asthma     as child, inhaler for a year  . Urinary tract infection   . Anxiety     Past Surgical History  Procedure Laterality Date  . Cesarean section  04/16/2011    Procedure: CESAREAN SECTION;  Surgeon: Roseanna RainbowLisa A Jackson-Moore, MD;  Location: [facility];  Service: Gynecology;  Laterality: N/A;  . Arm surgery Right     Family History  Problem Relation Age of Onset  . Anesthesia problems Neg Hx   . Hypotension Neg Hx   . Malignant hyperthermia Neg Hx   . Pseudochol deficiency Neg Hx     History  Substance Use Topics  . Smoking status: Former Smoker    Types: Cigars    Quit date: 04/08/2013  . Smokeless tobacco: Never Used  . Alcohol Use: No    Allergies:  Allergies  Allergen Reactions  . Amoxicillin Hives  . Penicillins Hives    Prescriptions prior to admission  Medication Sig Dispense Refill  . acetaminophen (TYLENOL) 325 MG tablet Take 650 mg by mouth every 6 (six) hours as needed for headache.      . Prenat-FeCbn-FeAspGl-FA-Omega (OB COMPLETE  PETITE) 35-5-1-200 MG CAPS Take 1 capsule by mouth daily.      . [DISCONTINUED] Prenat-FeCbn-FeAspGl-FA-Omega (OB COMPLETE PETITE) 35-5-1-200 MG CAPS Take 1 capsule by mouth daily.  30 capsule  11    Review of Systems  Constitutional: Negative for fever.  Gastrointestinal: Positive for nausea. Negative for vomiting.  Genitourinary:       No vaginal discharge. No vaginal bleeding. No dysuria.  Musculoskeletal: Negative for back pain.   Physical Exam   Blood pressure 123/72, pulse 114, temperature 98.3 F (36.8 C), temperature source Oral, resp. rate 18, height 5' 1.5" (1.562 m), weight 177 lb 6 oz (80.457 kg), last menstrual period 03/19/2013.  Physical Exam  Nursing note and vitals reviewed. Constitutional: She is oriented to person, place, and time. She appears well-developed and well-nourished. No distress.  HENT:  Head: Normocephalic.  Eyes: EOM are normal.  Neck: Neck supple.  Cardiovascular:  Periodic Irregular rate noted by ausculation and palpation of pulse.  Pulse 93  Respiratory: Effort normal and breath sounds normal.  GI: Soft. There is no tenderness.  Musculoskeletal: Normal range of motion.  No CVA tenderness.  Neurological: She is alert and oriented to person, place, and  time.  Skin: Skin is warm and dry.  Psychiatric: She has a normal mood and affect.    MAU Course  Procedures Given one bag of IVF in MAU due to nausea.  Also had Zofran in MAU and was able to drink water and eat crackers before discharge.  MDM Results for orders placed during the hospital encounter of 08/12/13 (from the past 24 hour(s))  URINALYSIS, ROUTINE W REFLEX MICROSCOPIC     Status: Abnormal   Collection Time    08/12/13 10:30 AM      Result Value Ref Range   Color, Urine YELLOW  YELLOW   APPearance HAZY (*) CLEAR   Specific Gravity, Urine >1.030 (*) 1.005 - 1.030   pH 6.0  5.0 - 8.0   Glucose, UA NEGATIVE  NEGATIVE mg/dL   Hgb urine dipstick TRACE (*) NEGATIVE   Bilirubin  Urine NEGATIVE  NEGATIVE   Ketones, ur >80 (*) NEGATIVE mg/dL   Protein, ur 30 (*) NEGATIVE mg/dL   Urobilinogen, UA 0.2  0.0 - 1.0 mg/dL   Nitrite NEGATIVE  NEGATIVE   Leukocytes, UA MODERATE (*) NEGATIVE  URINE MICROSCOPIC-ADD ON     Status: Abnormal   Collection Time    08/12/13 10:30 AM      Result Value Ref Range   Squamous Epithelial / LPF FEW (*) RARE   WBC, UA TOO NUMEROUS TO COUNT  <3 WBC/hpf   RBC / HPF 0-2  <3 RBC/hpf   Bacteria, UA MANY (*) RARE     Assessment and Plan  UTI Palpitations  Plan Make an appointment to see your doctor this week to follow up on the palpitations which are worse during this pregnancy. Get your prescriptions filled and take as directed. Return to the hospital if you are vomiting or have a fever or feeling worse. Drink at least 8 8-oz glasses of water every day. Take Tylenol 325 mg 2 tablets by mouth every 4 hours if needed for pain. Eat small meals or snacks every 3-4 hours so you will feel better. Urine culture pending.  Dorien Bessent 08/12/2013, 11:34 AM

## 2013-08-12 NOTE — Discharge Instructions (Signed)
Make an appointment to see your doctor this week to follow up on the palpitations which are worse during this pregnancy. Get your prescriptions filled and take as directed. Return to the hospital if you are vomiting or have a fever or feeling worse. Drink at least 8 8-oz glasses of water every day. Take Tylenol 325 mg 2 tablets by mouth every 4 hours if needed for pain. Eat small meals or snacks every 3-4 hours so you will feel better.

## 2013-08-12 NOTE — MAU Note (Addendum)
Patient presents with complaints of tachycardia X 24 hours dizziness X 48 hours and abdominal cramping X 2-3 days.

## 2013-08-14 ENCOUNTER — Encounter: Payer: Medicaid Other | Admitting: Obstetrics & Gynecology

## 2013-08-15 LAB — CULTURE, OB URINE: Colony Count: >=100000

## 2013-08-16 ENCOUNTER — Other Ambulatory Visit: Payer: Self-pay | Admitting: Advanced Practice Midwife

## 2013-08-31 ENCOUNTER — Encounter: Payer: Self-pay | Admitting: Advanced Practice Midwife

## 2013-08-31 ENCOUNTER — Ambulatory Visit (INDEPENDENT_AMBULATORY_CARE_PROVIDER_SITE_OTHER): Payer: Medicaid Other | Admitting: Advanced Practice Midwife

## 2013-08-31 VITALS — BP 106/71 | Temp 98.6°F | Wt 177.0 lb

## 2013-08-31 DIAGNOSIS — O2340 Unspecified infection of urinary tract in pregnancy, unspecified trimester: Secondary | ICD-10-CM

## 2013-08-31 DIAGNOSIS — Z3480 Encounter for supervision of other normal pregnancy, unspecified trimester: Secondary | ICD-10-CM

## 2013-08-31 DIAGNOSIS — O2390 Unspecified genitourinary tract infection in pregnancy, unspecified trimester: Secondary | ICD-10-CM

## 2013-08-31 DIAGNOSIS — N390 Urinary tract infection, site not specified: Secondary | ICD-10-CM

## 2013-08-31 LAB — POCT URINALYSIS DIPSTICK
Bilirubin, UA: NEGATIVE
Glucose, UA: NEGATIVE
NITRITE UA: NEGATIVE
Spec Grav, UA: 1.025
UROBILINOGEN UA: NEGATIVE
pH, UA: 5

## 2013-08-31 NOTE — Progress Notes (Signed)
Pulse 92 Pt states that she is trouble with sleep.  Pt states in her current situation she has her 23 y/o sleeping with her.  Pt states that she is having a lot of pelvic pain and discomfort.  Pt was seen at MAU recently and was treated for UTI.  Pt would like to know if it has cleared.  Pt states that she is also having nausea and is having to make herself eat.  Pt would like to discuss options for treating nausea.

## 2013-08-31 NOTE — Progress Notes (Signed)
Subjective: Peggyann ShoalsKhalilah Getman is a 23 y.o. at 23 weeks by LMP, early ultrasound  Patient denies vaginal leaking of fluid or bleeding, denies contractions.  Reports postitive fetal movment.  Patient reports pelvic discomfort x2 weeks. States it has been going on and off. States that the macrobid never really changed her symptoms. Reports her pelvis hurts and she can feel popping when she walks or changes position.   Objective: Filed Vitals:   08/31/13 1334  BP: 106/71  Temp: 98.6 F (37 C)   140 FHR 23 Fundal Height Urine dipstick shows positive for nitrites, red blood cells, bacteria, ketones.   Assessment: Patient Active Problem List   Diagnosis Date Noted  . Previous cesarean section 05/08/2013  . Supervision of other normal pregnancy 05/08/2013  . Vaginitis and vulvovaginitis, unspecified 09/19/2012  Potential UTI, culture pending Dehydration Pelvic discomfort of pregnancy, musculoskeletal  Plan: Patient to return to clinic in 4 weeks Encouraged hydration Abdominal band Rx given Orders Placed This Encounter  Procedures  . Culture, OB Urine  . POCT urinalysis dipstick  Reviewed warning signs in pregnancy. Patient to call with concerns PRN. Reviewed triage location. Patient to call w/ fever, pain, etc. Awaiting culture results before prescribing Rx.  20 min spent with patient greater than 80% spent in counseling and coordination of care.   Deangelo Berns Wilson SingerWren CNM

## 2013-09-01 LAB — CULTURE, OB URINE
COLONY COUNT: NO GROWTH
Organism ID, Bacteria: NO GROWTH

## 2013-09-04 ENCOUNTER — Other Ambulatory Visit: Payer: Medicaid Other

## 2013-09-08 ENCOUNTER — Other Ambulatory Visit: Payer: Self-pay | Admitting: *Deleted

## 2013-09-08 ENCOUNTER — Other Ambulatory Visit: Payer: Self-pay | Admitting: Advanced Practice Midwife

## 2013-09-08 DIAGNOSIS — B379 Candidiasis, unspecified: Secondary | ICD-10-CM

## 2013-09-08 MED ORDER — BUTOCONAZOLE NITRATE (1 DOSE) 2 % VA CREA: 1.0000 | TOPICAL_CREAM | Freq: Every day | VAGINAL | Status: DC

## 2013-09-08 MED ORDER — TERCONAZOLE 0.4 % VA CREA: 1.0000 | TOPICAL_CREAM | Freq: Every day | VAGINAL | Status: DC

## 2013-09-08 MED ORDER — FLUCONAZOLE 150 MG PO TABS: 150.0000 mg | ORAL_TABLET | Freq: Once | ORAL | Status: DC

## 2013-09-08 NOTE — Progress Notes (Signed)
Patient reports symptoms of yeast infection. Reports white discharge, itching and burning.  Diflucan to pharmacy.  Neveah Bang Wilson SingerWren CNM

## 2013-09-11 ENCOUNTER — Other Ambulatory Visit: Payer: Medicaid Other

## 2013-09-11 ENCOUNTER — Encounter: Payer: Medicaid Other | Admitting: Obstetrics & Gynecology

## 2013-09-11 DIAGNOSIS — Z3480 Encounter for supervision of other normal pregnancy, unspecified trimester: Secondary | ICD-10-CM

## 2013-09-11 LAB — CBC
HEMATOCRIT: 35.3 % — AB (ref 36.0–46.0)
HEMOGLOBIN: 12.2 g/dL (ref 12.0–15.0)
MCH: 31 pg (ref 26.0–34.0)
MCHC: 34.6 g/dL (ref 30.0–36.0)
MCV: 89.6 fL (ref 78.0–100.0)
Platelets: 262 10*3/uL (ref 150–400)
RBC: 3.94 MIL/uL (ref 3.87–5.11)
RDW: 14.8 % (ref 11.5–15.5)
WBC: 6.6 10*3/uL (ref 4.0–10.5)

## 2013-09-12 LAB — RPR

## 2013-09-12 LAB — GLUCOSE TOLERANCE, 2 HOURS W/ 1HR
Glucose, 1 hour: 49 mg/dL — ABNORMAL LOW (ref 70–170)
Glucose, 2 hour: 88 mg/dL (ref 70–139)
Glucose, Fasting: 56 mg/dL — ABNORMAL LOW (ref 70–99)

## 2013-09-12 LAB — HIV ANTIBODY (ROUTINE TESTING W REFLEX): HIV 1&2 Ab, 4th Generation: NONREACTIVE

## 2013-09-14 ENCOUNTER — Encounter: Payer: Medicaid Other | Admitting: Obstetrics & Gynecology

## 2013-09-14 ENCOUNTER — Other Ambulatory Visit: Payer: Medicaid Other

## 2013-09-14 ENCOUNTER — Ambulatory Visit (INDEPENDENT_AMBULATORY_CARE_PROVIDER_SITE_OTHER): Payer: Medicaid Other | Admitting: Obstetrics & Gynecology

## 2013-09-14 VITALS — BP 99/66 | HR 83 | Temp 97.5°F | Wt 181.0 lb

## 2013-09-14 DIAGNOSIS — E669 Obesity, unspecified: Secondary | ICD-10-CM

## 2013-09-14 DIAGNOSIS — Z3480 Encounter for supervision of other normal pregnancy, unspecified trimester: Secondary | ICD-10-CM

## 2013-09-14 NOTE — Progress Notes (Signed)
     Subjective:    Krista Meza is a 23 y.o. female being seen today for her obstetrical visit. She is at 82101w5d gestation. Patient reports: no complaints . Fetal movement: normal.  Problem List Items Addressed This Visit   Supervision of other normal pregnancy - Primary   Obesity (BMI 30-39.9)     Patient Active Problem List   Diagnosis Date Noted  . Obesity (BMI 30-39.9) 09/17/2013  . Previous cesarean section 05/08/2013  . Supervision of other normal pregnancy 05/08/2013  . Vaginitis and vulvovaginitis, unspecified 09/19/2012   Objective:    BP 99/66  Pulse 83  Temp(Src) 97.5 F (36.4 C)  Wt 82.101 kg (181 lb)  LMP 03/19/2013 FHT: 150 BPM  Uterine Size: size equals dates     Assessment:    Pregnancy @ 61101w5d    Plan:    VBAC: discussed and undecided. Smoking cessation discussed never smoked. Labs, problem list reviewed and updated Follow up in 4 weeks.

## 2013-09-15 ENCOUNTER — Encounter: Payer: Self-pay | Admitting: Obstetrics & Gynecology

## 2013-09-17 ENCOUNTER — Encounter: Payer: Self-pay | Admitting: Obstetrics & Gynecology

## 2013-09-17 DIAGNOSIS — E669 Obesity, unspecified: Secondary | ICD-10-CM | POA: Insufficient documentation

## 2013-09-17 NOTE — Patient Instructions (Signed)
Second Trimester of Pregnancy The second trimester is from week 13 through week 28, months 4 through 6. The second trimester is often a time when you feel your best. Your body has also adjusted to being pregnant, and you begin to feel better physically. Usually, morning sickness has lessened or quit completely, you may have more energy, and you may have an increase in appetite. The second trimester is also a time when the fetus is growing rapidly. At the end of the sixth month, the fetus is about 9 inches long and weighs about 1 pounds. You will likely begin to feel the baby move (quickening) between 18 and 20 weeks of the pregnancy. BODY CHANGES Your body goes through many changes during pregnancy. The changes vary from woman to woman.   Your weight will continue to increase. You will notice your lower abdomen bulging out.  You may begin to get stretch marks on your hips, abdomen, and breasts.  You may develop headaches that can be relieved by medicines approved by your caregiver.  You may urinate more often because the fetus is pressing on your bladder.  You may develop or continue to have heartburn as a result of your pregnancy.  You may develop constipation because certain hormones are causing the muscles that push waste through your intestines to slow down.  You may develop hemorrhoids or swollen, bulging veins (varicose veins).  You may have back pain because of the weight gain and pregnancy hormones relaxing your joints between the bones in your pelvis and as a result of a shift in weight and the muscles that support your balance.  Your breasts will continue to grow and be tender.  Your gums may bleed and may be sensitive to brushing and flossing.  Dark spots or blotches (chloasma, mask of pregnancy) may develop on your face. This will likely fade after the baby is born.  A dark line from your belly button to the pubic area (linea nigra) may appear. This will likely fade after the  baby is born. WHAT TO EXPECT AT YOUR PRENATAL VISITS During a routine prenatal visit:  You will be weighed to make sure you and the fetus are growing normally.  Your blood pressure will be taken.  Your abdomen will be measured to track your baby's growth.  The fetal heartbeat will be listened to.  Any test results from the previous visit will be discussed. Your caregiver may ask you:  How you are feeling.  If you are feeling the baby move.  If you have had any abnormal symptoms, such as leaking fluid, bleeding, severe headaches, or abdominal cramping.  If you have any questions. Other tests that may be performed during your second trimester include:  Blood tests that check for:  Low iron levels (anemia).  Gestational diabetes (between 24 and 28 weeks).  Rh antibodies.  Urine tests to check for infections, diabetes, or protein in the urine.  An ultrasound to confirm the proper growth and development of the baby.  An amniocentesis to check for possible genetic problems.  Fetal screens for spina bifida and Down syndrome. HOME CARE INSTRUCTIONS   Avoid all smoking, herbs, alcohol, and unprescribed drugs. These chemicals affect the formation and growth of the baby.  Follow your caregiver's instructions regarding medicine use. There are medicines that are either safe or unsafe to take during pregnancy.  Exercise only as directed by your caregiver. Experiencing uterine cramps is a good sign to stop exercising.  Continue to eat regular,   healthy meals.  Wear a good support bra for breast tenderness.  Do not use hot tubs, steam rooms, or saunas.  Wear your seat belt at all times when driving.  Avoid raw meat, uncooked cheese, cat litter boxes, and soil used by cats. These carry germs that can cause birth defects in the baby.  Take your prenatal vitamins.  Try taking a stool softener (if your caregiver approves) if you develop constipation. Eat more high-fiber foods,  such as fresh vegetables or fruit and whole grains. Drink plenty of fluids to keep your urine clear or pale yellow.  Take warm sitz baths to soothe any pain or discomfort caused by hemorrhoids. Use hemorrhoid cream if your caregiver approves.  If you develop varicose veins, wear support hose. Elevate your feet for 15 minutes, 3 4 times a day. Limit salt in your diet.  Avoid heavy lifting, wear low heel shoes, and practice good posture.  Rest with your legs elevated if you have leg cramps or low back pain.  Visit your dentist if you have not gone yet during your pregnancy. Use a soft toothbrush to brush your teeth and be gentle when you floss.  A sexual relationship may be continued unless your caregiver directs you otherwise.  Continue to go to all your prenatal visits as directed by your caregiver. SEEK MEDICAL CARE IF:   You have dizziness.  You have mild pelvic cramps, pelvic pressure, or nagging pain in the abdominal area.  You have persistent nausea, vomiting, or diarrhea.  You have a bad smelling vaginal discharge.  You have pain with urination. SEEK IMMEDIATE MEDICAL CARE IF:   You have a fever.  You are leaking fluid from your vagina.  You have spotting or bleeding from your vagina.  You have severe abdominal cramping or pain.  You have rapid weight gain or loss.  You have shortness of breath with chest pain.  You notice sudden or extreme swelling of your face, hands, ankles, feet, or legs.  You have not felt your baby move in over an hour.  You have severe headaches that do not go away with medicine.  You have vision changes. Document Released: 05/05/2001 Document Revised: 01/11/2013 Document Reviewed: 07/12/2012 [employer] Patient Information 2014 [employer].  

## 2013-10-12 ENCOUNTER — Encounter: Payer: Medicaid Other | Admitting: Obstetrics & Gynecology

## 2013-10-13 ENCOUNTER — Ambulatory Visit (INDEPENDENT_AMBULATORY_CARE_PROVIDER_SITE_OTHER): Payer: Medicaid Other | Admitting: Advanced Practice Midwife

## 2013-10-13 VITALS — BP 115/77 | HR 103 | Temp 98.3°F | Wt 178.0 lb

## 2013-10-13 DIAGNOSIS — Z113 Encounter for screening for infections with a predominantly sexual mode of transmission: Secondary | ICD-10-CM

## 2013-10-13 DIAGNOSIS — L988 Other specified disorders of the skin and subcutaneous tissue: Secondary | ICD-10-CM

## 2013-10-14 ENCOUNTER — Encounter: Payer: Self-pay | Admitting: Advanced Practice Midwife

## 2013-10-14 DIAGNOSIS — A630 Anogenital (venereal) warts: Secondary | ICD-10-CM | POA: Insufficient documentation

## 2013-10-14 HISTORY — DX: Anogenital (venereal) warts: A63.0

## 2013-10-14 LAB — WET PREP BY MOLECULAR PROBE
Candida species: NEGATIVE
GARDNERELLA VAGINALIS: NEGATIVE
Trichomonas vaginosis: NEGATIVE

## 2013-10-14 LAB — HIV ANTIBODY (ROUTINE TESTING W REFLEX): HIV 1&2 Ab, 4th Generation: NONREACTIVE

## 2013-10-14 LAB — RPR

## 2013-10-14 LAB — HEPATITIS B SURFACE ANTIGEN: Hepatitis B Surface Ag: NEGATIVE

## 2013-10-14 LAB — GC/CHLAMYDIA PROBE AMP
CT PROBE, AMP APTIMA: NEGATIVE
GC Probe RNA: NEGATIVE

## 2013-10-14 LAB — HEPATITIS C ANTIBODY: HCV Ab: NEGATIVE

## 2013-10-14 NOTE — Progress Notes (Signed)
Subjective: Krista Meza is a 23 y.o. at 29 weeks by LMP, early ultrasound  Patient denies vaginal leaking of fluid or bleeding, denies contractions.  Reports positive fetal movment.  Patient reports vaginal lesion noticed on Monday. Denies abnormal itching, burning or other symptoms. Reports increased vaginal discharge. Concerned as to what this is. Denies dysmenorrhea.   Objective: Filed Vitals:   10/13/13 1445  BP: 115/77  Pulse: 103  Temp: 98.3 F (36.8 C)   140 FHR 30 Fundal Height Fetal Position unknown  Physical Examination: Pelvic - normal external genitalia, vagina, cervix, uterus and adnexa, VULVA: lesion noted at introitus, 6 oclock. Approx 2.5 cm, irregular smooth borders, white plaque.  Assessment: Patient Active Problem List   Diagnosis Date Noted  . Skin plaque 10/14/2013  . Obesity (BMI 30-39.9) 09/17/2013  . Previous cesarean section 05/08/2013  . Supervision of other normal pregnancy 05/08/2013  . Vaginitis and vulvovaginitis, unspecified 09/19/2012  STI screen  Plan: Patient to return to clinic in 2 weeks Consulted w/ MD Clearance Coots about white vaginal plaque, will biopsy postpartum or will treat if patient becomes symptomatic. Consider treatment for prevention of development or furthering potential of lichen sclerosis if patient desires. Discuss w/ patient @ [facility]. Reviewed warning signs in pregnancy. Patient to call with concerns PRN. Reviewed triage location. Discuss VBAC plans [facility]. STI screening today.  Krista Meza CNM

## 2013-10-23 ENCOUNTER — Telehealth: Payer: Self-pay | Admitting: *Deleted

## 2013-10-23 NOTE — Telephone Encounter (Signed)
Pt called into office in regards to lab results from last week.   Call placed back to pt at 9:50 am.  Pt made aware of lab results.   Pt had question in regards to getting an u/s.  Pt states that it was previously discussed with Dr Tamela Oddi that there may be need for u/s if the pt continued to not gain weight.  Pt would like to know if this is necessary. Pt made aware office would let her know what Tamela Oddi advises for u/s otherwise will be discussed at visit this week.  Pt also request next appointment to be with Dr Tamela Oddi due to problem in vaginal area seen at last visit.  Pt appointment rescheduled so that she will be able to see Tamela Oddi.  Please advise in regards to u/s.

## 2013-10-23 NOTE — Telephone Encounter (Signed)
Will discuss need for U/S at next visit

## 2013-10-24 NOTE — Telephone Encounter (Signed)
Pt made aware that u/s would be discussed at appt this week.

## 2013-10-26 ENCOUNTER — Ambulatory Visit (INDEPENDENT_AMBULATORY_CARE_PROVIDER_SITE_OTHER): Payer: Medicaid Other | Admitting: Obstetrics & Gynecology

## 2013-10-26 VITALS — BP 120/74 | HR 89 | Temp 99.4°F

## 2013-10-26 DIAGNOSIS — O4700 False labor before 37 completed weeks of gestation, unspecified trimester: Secondary | ICD-10-CM

## 2013-10-26 DIAGNOSIS — Z3480 Encounter for supervision of other normal pregnancy, unspecified trimester: Secondary | ICD-10-CM

## 2013-10-26 NOTE — Progress Notes (Signed)
Subjective: Krista Meza is a 23 y.o. at [redacted]w[redacted]d  weeks by LMP, early ultrasound  Patient c/o pelvic pain/pressure  Reports positive fetal movment.  Patient reports vaginal lesion noticed on Monday. Denies abnormal itching, burning or other symptoms. Reports increased vaginal discharge. Concerned as to what this is.    Objective: Filed Vitals:   10/26/13 1444  BP: 120/74  Pulse: 89  Temp: 99.4 F (37.4 C)   140 FHR 30 Fundal Height Fetal Position Vtx  Physical Examination: VULVA: lesion noted at introitus, 6 oclock. Approx 2.5 cm, irregular smooth borders, white plaque.  Assessment: Patient Active Problem List   Diagnosis Date Noted  . Skin plaque 10/14/2013  . Obesity (BMI 30-39.9) 09/17/2013  . Previous cesarean section 05/08/2013  . Supervision of other normal pregnancy 05/08/2013  . 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

## 2013-10-26 NOTE — Patient Instructions (Signed)
Genital Warts Genital warts are a sexually transmitted infection. They may appear as small bumps on the tissues of the genital area. CAUSES  Genital warts are caused by a virus called human papillomavirus (HPV). HPV is the most common sexually transmitted disease (STD) and infection of the sex organs. This infection is spread by having unprotected sex with an infected person. It can be spread by vaginal, anal, and oral sex. Many people do not know they are infected. They may be infected for years without problems. However, even if they do not have problems, they can unknowingly pass the infection to their sexual partners. SYMPTOMS   Itching and irritation in the genital area.  Warts that bleed.  Painful sexual intercourse. DIAGNOSIS  Warts are usually recognized with the naked eye on the vagina, vulva, perineum, anus, and rectum. Certain tests can also diagnose genital warts, such as:  A Pap test.  A tissue sample (biopsy) exam.  Colposcopy. A magnifying tool is used to examine the vagina and cervix. The HPV cells will change color when certain solutions are used. TREATMENT  Warts can be removed by:  Applying certain chemicals, such as cantharidin or podophyllin.  Liquid nitrogen freezing (cryotherapy).  Immunotherapy with candida or trichophyton injections.  Laser treatment.  Burning with an electrified probe (electrocautery).  Interferon injections.  Surgery. PREVENTION  HPV vaccination can help prevent HPV infections that cause genital warts and that cause cancer of the cervix. It is recommended that the vaccination be given to people between the ages 9 to 26 years old. The vaccine might not work as well or might not work at all if you already have HPV. It should not be given to pregnant women. HOME CARE INSTRUCTIONS   It is important to follow your caregiver's instructions. The warts will not go away without treatment. Repeat treatments are often needed to get rid of warts.  Even after it appears that the warts are gone, the normal tissue underneath often remains infected.  Do not try to treat genital warts with medicine used to treat hand warts. This type of medicine is strong and can burn the skin in the genital area, causing more damage.  Tell your past and current sexual partner(s) that you have genital warts. They may be infected also and need treatment.  Avoid sexual contact while being treated.  Do not touch or scratch the warts. The infection may spread to other parts of your body.  Women with genital warts should have a cervical cancer check (Pap test) at least once a year. This type of cancer is slow-growing and can be cured if found early. Chances of developing cervical cancer are increased with HPV.  Inform your obstetrician about your warts in the event of pregnancy. This virus can be passed to the baby's respiratory tract. Discuss this with your caregiver.  Use a condom during sexual intercourse. Following treatment, the use of condoms will help prevent reinfection.  Ask your caregiver about using over-the-counter anti-itch creams. SEEK MEDICAL CARE IF:   Your treated skin becomes red, swollen, or painful.  You have a fever.  You feel generally ill.  You feel little lumps in and around your genital area.  You are bleeding or have painful sexual intercourse. MAKE SURE YOU:   Understand these instructions.  Will watch your condition.  Will get help right away if you are not doing well or get worse. Document Released: 05/08/2000 Document Revised: 08/03/2011 Document Reviewed: 11/17/2010 [employer] Patient Information 2014 [employer].  

## 2013-10-27 ENCOUNTER — Encounter: Payer: Medicaid Other | Admitting: Advanced Practice Midwife

## 2013-10-30 LAB — POCT URINALYSIS DIPSTICK
Bilirubin, UA: NEGATIVE
Glucose, UA: NEGATIVE
KETONES UA: NEGATIVE
Leukocytes, UA: NEGATIVE
Nitrite, UA: NEGATIVE
PROTEIN UA: NEGATIVE
RBC UA: NEGATIVE
SPEC GRAV UA: 1.015
Urobilinogen, UA: NEGATIVE
pH, UA: 7

## 2013-11-09 ENCOUNTER — Encounter: Payer: Medicaid Other | Admitting: Obstetrics & Gynecology

## 2013-11-09 ENCOUNTER — Encounter: Payer: Self-pay | Admitting: Obstetrics & Gynecology

## 2013-11-09 ENCOUNTER — Ambulatory Visit (INDEPENDENT_AMBULATORY_CARE_PROVIDER_SITE_OTHER): Payer: Medicaid Other | Admitting: Obstetrics & Gynecology

## 2013-11-09 VITALS — BP 110/70 | HR 102 | Temp 98.9°F | Wt 182.0 lb

## 2013-11-09 DIAGNOSIS — Z3483 Encounter for supervision of other normal pregnancy, third trimester: Secondary | ICD-10-CM

## 2013-11-09 DIAGNOSIS — Z3480 Encounter for supervision of other normal pregnancy, unspecified trimester: Secondary | ICD-10-CM

## 2013-11-09 LAB — POCT URINALYSIS DIPSTICK
BILIRUBIN UA: NEGATIVE
Blood, UA: NEGATIVE
Glucose, UA: NEGATIVE
Ketones, UA: NEGATIVE
Nitrite, UA: NEGATIVE
PH UA: 7
PROTEIN UA: NEGATIVE
SPEC GRAV UA: 1.02
Urobilinogen, UA: NEGATIVE

## 2013-11-10 NOTE — Patient Instructions (Signed)
Vaginal Birth After Cesarean Delivery Vaginal birth after cesarean delivery (VBAC) is giving birth vaginally after previously delivering a baby by a cesarean. In the past, if a woman had a cesarean delivery, all births afterwards would be done by cesarean delivery. This is no longer true. It can be safe for the mother to try a vaginal delivery after having a cesarean delivery.  It is important to discuss VBAC with your health care Krista Meza early in the pregnancy so you can understand the risks, benefits, and options. It will give you time to decide what is best in your particular case. The final decision about whether to have a VBAC or repeat cesarean delivery should be between you and your health care Krista Meza. Any changes in your health or your baby's health during your pregnancy may make it necessary to change your initial decision about VBAC.  WOMEN WHO PLAN TO HAVE A VBAC SHOULD CHECK WITH THEIR HEALTH CARE Krista Meza TO BE SURE THAT:  The previous cesarean delivery was done with a low transverse uterine cut (incision) (not a vertical classical incision).   The birth canal is big enough for the baby.   There were no other operations on the uterus.   An electronic fetal monitor (EFM) will be on at all times during labor.   An operating room will be available and ready in case an emergency cesarean delivery is needed.   A health care Krista Meza and surgical nursing staff will be available at all times during labor to be ready to do an emergency delivery cesarean if necessary.   An anesthesiologist will be present in case an emergency cesarean delivery is needed.   The nursery is prepared and has adequate personnel and necessary equipment available to care for the baby in case of an emergency cesarean delivery. BENEFITS OF VBAC  Shorter stay in the hospital.   Avoidance of risks associated with cesarean delivery, such as:  Surgical complications, such as opening of the incision or  hernia in the incision.  Injury to other organs.  Fever. This can occur if an infection develops after surgery. It can also occur as a reaction to the medicine given to make you numb during the surgery.  Less blood loss and need for blood transfusions.  Lower risk of blood clots and infection.  Shorter recovery.   Decreased risk for having to remove the uterus (hysterectomy).   Decreased risk for the placenta to completely or partially cover the opening of the uterus (placenta previa) with a future pregnancy.   Decrease risk in future labor and delivery. RISKS OF A VBAC  Tearing (rupture) of the uterus. This is occurs in less than 1% of VBACs. The risk of this happening is higher if:  Steps are taken to begin the labor process (induce labor) or stimulate or strengthen contractions (augment labor).   Medicine is used to soften (ripen) the cervix.  Having to remove the uterus (hysterectomy) if it ruptures. VBAC SHOULD NOT BE DONE IF:  The previous cesarean delivery was done with a vertical (classical) or T-shaped incision or you do not know what kind of incision was made.   You had a ruptured uterus.   You have had certain types of surgery on your uterus, such as removal of uterine fibroids. Ask your health care Krista Meza about other types of surgeries that prevent you from having a VBAC.  You have certain medical or childbirth (obstetrical) problems.   There are problems with the baby.   You   have had two previous cesarean deliveries and no vaginal deliveries. OTHER FACTS TO KNOW ABOUT VBAC:  It is safe to have an epidural anesthetic with VBAC.   It is safe to turn the baby from a breech position (attempt an external cephalic version).   It is safe to try a VBAC with twins.   VBAC may not be successful if your baby weights 8.8 lb (4 kg) or more. However, weight predictions are not always accurate and should not be used alone to decide if VBAC is right for  you.  There is an increased failure rate if the time between the cesarean delivery and VBAC is less than 19 months.   Your health care Krista Meza may advise against a VBAC if you have preeclampsia (high blood pressure, protein in the urine, and swelling of face and extremities).   VBAC is often successful if you previously gave birth vaginally.   VBAC is often successful when the labor starts spontaneously before the due date.   Delivering a baby through a VBAC is similar to having a normal spontaneous vaginal delivery. Document Released: 11/01/2006 Document Revised: 03/01/2013 Document Reviewed: 12/08/2012 [facility] Patient Information 2015 [place], [place]. This information is not intended to replace advice given to you by your health care Krista Meza. Make sure you discuss any questions you have with your health care Krista Meza.

## 2013-11-10 NOTE — Progress Notes (Signed)
Subjective:    Krista Meza is a 23 y.o. female being seen today for her obstetrical visit. She is at 7547w5d gestation. Patient reports no complaints. Fetal movement: normal.  Problem List Items Addressed This Visit   Supervision of other normal pregnancy - Primary   Relevant Orders      POCT urinalysis dipstick (Completed)     Patient Active Problem List   Diagnosis Date Noted  . Condyloma acuminata of vulva in pregnancy in third trimester 10/14/2013  . Obesity (BMI 30-39.9) 09/17/2013  . Previous cesarean section 05/08/2013  . Supervision of other normal pregnancy 05/08/2013  . Vaginitis and vulvovaginitis, unspecified 09/19/2012   Objective:    BP 110/70  Pulse 102  Temp(Src) 98.9 F (37.2 C)  Wt 82.555 kg (182 lb)  LMP 03/19/2013 FHT:  140 BPM  Uterine Size: size equals dates  Presentation: cephalic     Assessment:    Pregnancy @ 5447w5d weeks   Plan:     labs reviewed, problem list updated VBAC onsent signed. GBS next visit  Orders Placed This Encounter  Procedures  . POCT urinalysis dipstick    Follow up in 2 Weeks.

## 2013-11-23 ENCOUNTER — Encounter: Payer: Medicaid Other | Admitting: Obstetrics & Gynecology

## 2013-11-29 ENCOUNTER — Ambulatory Visit (INDEPENDENT_AMBULATORY_CARE_PROVIDER_SITE_OTHER): Payer: Medicaid Other | Admitting: Obstetrics & Gynecology

## 2013-11-29 ENCOUNTER — Encounter: Payer: Self-pay | Admitting: Obstetrics & Gynecology

## 2013-11-29 VITALS — BP 112/73 | HR 105 | Temp 99.0°F | Wt 186.0 lb

## 2013-11-29 DIAGNOSIS — Z3480 Encounter for supervision of other normal pregnancy, unspecified trimester: Secondary | ICD-10-CM

## 2013-11-29 DIAGNOSIS — O3090 Multiple gestation, unspecified, unspecified trimester: Secondary | ICD-10-CM

## 2013-11-29 DIAGNOSIS — O365911 Maternal care for other known or suspected poor fetal growth, first trimester, fetus 1: Secondary | ICD-10-CM

## 2013-11-29 DIAGNOSIS — O365990 Maternal care for other known or suspected poor fetal growth, unspecified trimester, not applicable or unspecified: Secondary | ICD-10-CM

## 2013-11-29 DIAGNOSIS — Z3483 Encounter for supervision of other normal pregnancy, third trimester: Secondary | ICD-10-CM

## 2013-11-29 LAB — POCT URINALYSIS DIPSTICK
Bilirubin, UA: NEGATIVE
Blood, UA: NEGATIVE
Glucose, UA: NEGATIVE
KETONES UA: NEGATIVE
Nitrite, UA: NEGATIVE
PH UA: 7
Protein, UA: NEGATIVE
Spec Grav, UA: 1.015
Urobilinogen, UA: NEGATIVE

## 2013-11-29 LAB — OB RESULTS CONSOLE GC/CHLAMYDIA
Chlamydia: NEGATIVE
Gonorrhea: NEGATIVE

## 2013-11-29 LAB — OB RESULTS CONSOLE GBS: STREP GROUP B AG: POSITIVE

## 2013-11-29 NOTE — Progress Notes (Signed)
Subjective:    Krista ShoalsKhalilah Meza is a 23 y.o. female being seen today for her obstetrical visit. She is at 3414w3d  gestation. Patient reports no complaints. Fetal movement: normal.  Problem List Items Addressed This Visit   Supervision of other normal pregnancy - Primary   Relevant Orders      POCT urinalysis dipstick (Completed)      Culture, Grp B Strep w/Rflx Suscept      GC/Chlamydia Probe Amp    Other Visit Diagnoses   Poor fetal growth, affecting management of mother, antepartum condition or complication, first trimester, fetus 1        Relevant Orders       US OB Follow Up      Patient Active Problem List   Diagnosis Date Noted  . Condyloma acuminata of vulva in pregnancy in third trimester 10/14/2013  . Obesity (BMI 30-39.9) 09/17/2013  . Previous cesarean section 05/08/2013  . Supervision of other normal pregnancy 05/08/2013  . Vaginitis and vulvovaginitis, unspecified 09/19/2012   Objective:    BP 112/73  Pulse 105  Temp(Src) 99 F (37.2 C)  Wt 84.369 kg (186 lb)  LMP 03/19/2013 FHT:  140 BPM  Uterine Size: size equals dates  Presentation: cephalic     Assessment:    Pregnancy @ 1114w3d  weeks   Plan:     labs reviewed, problem list updated   Orders Placed This Encounter  Procedures  . Culture, Grp B Strep w/Rflx Suscept  . GC/Chlamydia Probe Amp  . US OB Follow Up    Standing Status: Future     Number of Occurrences:      Standing Expiration Date: 01/31/2015    Order Specific Question:  Reason for Exam (SYMPTOM  OR DIAGNOSIS REQUIRED)    Answer:  Growth    Order Specific Question:  [facility]    [facility]  . POCT urinalysis dipstick    Follow up in 1 Week.

## 2013-11-29 NOTE — Patient Instructions (Signed)
Patient information: Group B streptococcus and pregnancy (Beyond the Basics)  Authors Karen M Puopolo, MD, PhD Carol J Baker, MD Section Editors Charles J Lockwood, MD Daniel J Sexton, MD Deputy Editor Vanessa A Barss, MD Disclosures  All topics are updated as new evidence becomes available and our peer review process is complete.  Literature review current through: Feb 2014.  This topic last updated: Nov 23, 2011.  INTRODUCTION - Group B streptococcus (GBS) is a bacterium that can cause serious infections in pregnant women and newborn babies. GBS is one of many types of streptococcal bacteria, sometimes called "strep." This article discusses GBS, its effect on pregnant women and infants, and ways to prevent complications of GBS. More detailed information about GBS is available by subscription. (See "Group B streptococcal infection in pregnant women".) WHAT IS GROUP B STREP INFECTION? - GBS is commonly found in the digestive system and the vagina. In healthy adults, GBS is not harmful and does not cause problems. But in pregnant women and newborn infants, being infected with GBS can cause serious illness. Approximately one in three to four pregnant women in the [place] carries GBS in their gastrointestinal system and/or in their vagina. Carrying GBS is not the same as being infected. Carriers are not sick and do not need treatment during pregnancy. There is no treatment that can stop you from carrying GBS.  Pregnant women who are carriers of GBS infrequently become infected with GBS. GBS can cause urinary tract infections, infection of the amniotic fluid (bag of water), and infection of the uterus after delivery. GBS infections during pregnancy may lead to preterm labor.  Pregnant women who carry GBS can pass on the bacteria to their newborns, and some of those babies become infected with GBS. Newborns who are infected with GBS can develop pneumonia (lung infection), septicemia (blood infection), or  meningitis (infection of the lining of the brain and spinal cord). These complications can be prevented by giving intravenous antibiotics during labor to any woman who is at risk of GBS infection. You are at risk of GBS infection if: You have a urine culture during your current pregnancy showing GBS  You have a vaginal and rectal culture during your current pregnancy showing GBS  You had an infant infected with GBS in the past GROUP B STREP PREVENTION - Most doctors and nurses recommend a urine culture early in your pregnancy to be sure that you do not have a bladder infection without symptoms. If you urine culture shows GBS or other bacteria, you may be treated with an antibiotic. If you have symptoms of urinary infection, such as pain with urination, any time during your pregnancy, a urine culture is done. If GBS grows from the urine culture, it should be treated with an antibiotic, and you should also receive intravenous antibiotics during labor. Expert groups recommend that all pregnant women have a GBS culture at 35 to 37 weeks of pregnancy. The culture is done by swabbing the vagina and rectum. If your GBS culture is positive, you will be given an intravenous antibiotic during labor. If you have preterm labor, the culture is done then and an intravenous antibiotic is given until the baby is born or the labor is stopped by your health care provider. If you have a positive GBS culture and you have an allergy to penicillin, be sure your doctor and nurse are aware of this allergy and tell them what happened with the allergy. If you had only a rash or itching, this   is not a serious allergy, and you can receive a common drug related to the penicillin. If you had a serious allergy (for example, trouble breathing, swelling of your face) you may need an additional test to determine which antibiotic should be used during labor. Being treated with an antibiotic during labor greatly reduces the chance that you or  your newborn will develop infections related to GBS. It is important to note that young infants up to age 3 months can also develop septicemia, meningitis and other serious infections from GBS. Being treated with an antibiotic during labor does not reduce the chance that your baby will develop this later type of infection. There is currently no known way of preventing this later-onset GBS disease. WHERE TO GET MORE INFORMATION - Your healthcare provider is the best source of information for questions and concerns related to your medical problem.  

## 2013-11-30 LAB — GC/CHLAMYDIA PROBE AMP
CT PROBE, AMP APTIMA: NEGATIVE
GC PROBE AMP APTIMA: NEGATIVE

## 2013-12-01 ENCOUNTER — Telehealth: Payer: Self-pay | Admitting: *Deleted

## 2013-12-01 NOTE — Telephone Encounter (Signed)
Pt called in to office requesting lab results.   Return call to pt making her aware of results that have resulted.  Pt made aware that GBS was not yet resulted in computer.   Pt states that she has been having some vaginal itching/irritation.  Pt denies any odor or d/c.  Pt states that she thinks it may be a yeast infection.  Pt made aware she could use Terazol 7 as per protocol.  Pt states that she had used this a few months ago with no relief, states it made her symptoms worse.  Pt made aware that we would f/u with Dr Tamela OddiJackson Moore as to what she would advise for further treatment.  Pt states understanding.  Please advise on other medication for yeast infection.

## 2013-12-03 LAB — CULTURE, STREPTOCOCCUS GRP B W/SUSCEPT

## 2013-12-06 ENCOUNTER — Encounter: Payer: Self-pay | Admitting: Obstetrics & Gynecology

## 2013-12-06 DIAGNOSIS — O99820 Streptococcus B carrier state complicating pregnancy: Secondary | ICD-10-CM | POA: Insufficient documentation

## 2013-12-06 NOTE — Telephone Encounter (Signed)
OK to treat yeast infection per protocol

## 2013-12-06 NOTE — Telephone Encounter (Signed)
Pt called to office to check on Rx for yeast infection.   Return call to pt stating waiting on response from provider.  Pt made aware if no response within next day, we will speak with Dr Tamela OddiJackson Moore in office on Thursday.  Pt agrees to this plan.  Please advise

## 2013-12-07 ENCOUNTER — Ambulatory Visit (HOSPITAL_COMMUNITY)
Admission: RE | Admit: 2013-12-07 | Discharge: 2013-12-07 | Disposition: A | Discharge status: Home / Self Care | Payer: Medicaid Other | Source: Ambulatory Visit | Attending: Obstetrics & Gynecology | Admitting: Obstetrics & Gynecology

## 2013-12-07 ENCOUNTER — Encounter: Payer: Self-pay | Admitting: Obstetrics & Gynecology

## 2013-12-07 ENCOUNTER — Encounter: Payer: Medicaid Other | Admitting: Obstetrics & Gynecology

## 2013-12-07 ENCOUNTER — Other Ambulatory Visit: Payer: Self-pay | Admitting: Obstetrics & Gynecology

## 2013-12-07 ENCOUNTER — Ambulatory Visit (INDEPENDENT_AMBULATORY_CARE_PROVIDER_SITE_OTHER): Payer: Medicaid Other | Admitting: Obstetrics & Gynecology

## 2013-12-07 VITALS — BP 116/71 | HR 86 | Temp 99.2°F | Wt 190.0 lb

## 2013-12-07 DIAGNOSIS — Z3483 Encounter for supervision of other normal pregnancy, third trimester: Secondary | ICD-10-CM

## 2013-12-07 DIAGNOSIS — Z3689 Encounter for other specified antenatal screening: Secondary | ICD-10-CM | POA: Diagnosis not present

## 2013-12-07 DIAGNOSIS — O365911 Maternal care for other known or suspected poor fetal growth, first trimester, fetus 1: Secondary | ICD-10-CM

## 2013-12-07 DIAGNOSIS — O365990 Maternal care for other known or suspected poor fetal growth, unspecified trimester, not applicable or unspecified: Secondary | ICD-10-CM | POA: Diagnosis not present

## 2013-12-07 DIAGNOSIS — Z3480 Encounter for supervision of other normal pregnancy, unspecified trimester: Secondary | ICD-10-CM

## 2013-12-07 LAB — POCT URINALYSIS DIPSTICK
Blood, UA: NEGATIVE
Glucose, UA: NEGATIVE
Ketones, UA: NEGATIVE
Nitrite, UA: NEGATIVE
Protein, UA: NEGATIVE
Spec Grav, UA: <=1.005
pH, UA: 6

## 2013-12-07 MED ORDER — TERCONAZOLE 0.4 % VA CREA: 1.0000 | TOPICAL_CREAM | Freq: Every day | VAGINAL | Status: DC

## 2013-12-07 NOTE — Addendum Note (Signed)
Addended by: Marya LandryFOSTER, Wrangler Penning D on: 12/07/2013 06:49 PM   Modules accepted: Orders

## 2013-12-07 NOTE — Progress Notes (Signed)
Subjective:    Krista Meza is a 23 y.o. female being seen today for her obstetrical visit. She is at 8124w4d gestation. Patient reports no complaints. Fetal movement: normal.  Problem List Items Addressed This Visit   Supervision of other normal pregnancy - Primary   Relevant Orders      POCT urinalysis dipstick     Patient Active Problem List   Diagnosis Date Noted  . GBS (group B Streptococcus carrier), +RV culture, currently pregnant 12/06/2013  . Condyloma acuminata of vulva in pregnancy in third trimester 10/14/2013  . Obesity (BMI 30-39.9) 09/17/2013  . Previous cesarean section 05/08/2013  . Supervision of other normal pregnancy 05/08/2013  . Vaginitis and vulvovaginitis, unspecified 09/19/2012    Objective:    BP 116/71  Pulse 86  Temp(Src) 99.2 F (37.3 C)  Wt 86.183 kg (190 lb)  LMP 03/19/2013 FHT: 140 BPM  Uterine Size: size equals dates  Presentations: cephalic     Assessment:    Pregnancy @ 5124w4d weeks   Plan:   Plans for delivery: VBAC planned; labs reviewed; problem list updated Counseling: Consent signed. L&D discussion: symptoms of labor, discussed when to call, discussed what number to call, anesthetic/analgesic options reviewed and delivering clinician:  plans no preference.   Follow up in 1 Week.

## 2013-12-07 NOTE — Patient Instructions (Signed)
Third Trimester of Pregnancy The third trimester is from week 29 through week 42, months 7 through 9. The third trimester is a time when the fetus is growing rapidly. At the end of the ninth month, the fetus is about 20 inches in length and weighs 6-10 pounds.  BODY CHANGES Your body goes through many changes during pregnancy. The changes vary from woman to woman.   Your weight will continue to increase. You can expect to gain 25-35 pounds (11-16 kg) by the end of the pregnancy.  You may begin to get stretch marks on your hips, abdomen, and breasts.  You may urinate more often because the fetus is moving lower into your pelvis and pressing on your bladder.  You may develop or continue to have heartburn as a result of your pregnancy.  You may develop constipation because certain hormones are causing the muscles that push waste through your intestines to slow down.  You may develop hemorrhoids or swollen, bulging veins (varicose veins).  You may have pelvic pain because of the weight gain and pregnancy hormones relaxing your joints between the bones in your pelvis. Backaches may result from overexertion of the muscles supporting your posture.  You may have changes in your hair. These can include thickening of your hair, rapid growth, and changes in texture. Some women also have hair loss during or after pregnancy, or hair that feels dry or thin. Your hair will most likely return to normal after your baby is born.  Your breasts will continue to grow and be tender. A yellow discharge may leak from your breasts called colostrum.  Your belly button may stick out.  You may feel short of breath because of your expanding uterus.  You may notice the fetus "dropping," or moving lower in your abdomen.  You may have a bloody mucus discharge. This usually occurs a few days to a week before labor begins.  Your cervix becomes thin and soft (effaced) near your due date. WHAT TO EXPECT AT YOUR PRENATAL  EXAMS  You will have prenatal exams every 2 weeks until week 36. Then, you will have weekly prenatal exams. During a routine prenatal visit:  You will be weighed to make sure you and the fetus are growing normally.  Your blood pressure is taken.  Your abdomen will be measured to track your baby's growth.  The fetal heartbeat will be listened to.  Any test results from the previous visit will be discussed.  You may have a cervical check near your due date to see if you have effaced. At around 36 weeks, your caregiver will check your cervix. At the same time, your caregiver will also perform a test on the secretions of the vaginal tissue. This test is to determine if a type of bacteria, Group B streptococcus, is present. Your caregiver will explain this further. Your caregiver may ask you:  What your birth plan is.  How you are feeling.  If you are feeling the baby move.  If you have had any abnormal symptoms, such as leaking fluid, bleeding, severe headaches, or abdominal cramping.  If you have any questions. Other tests or screenings that may be performed during your third trimester include:  Blood tests that check for low iron levels (anemia).  Fetal testing to check the health, activity level, and growth of the fetus. Testing is done if you have certain medical conditions or if there are problems during the pregnancy. FALSE LABOR You may feel small, irregular contractions that   eventually go away. These are called Braxton Hicks contractions, or false labor. Contractions may last for hours, days, or even weeks before true labor sets in. If contractions come at regular intervals, intensify, or become painful, it is best to be seen by your caregiver.  SIGNS OF LABOR   Menstrual-like cramps.  Contractions that are 5 minutes apart or less.  Contractions that start on the top of the uterus and spread down to the lower abdomen and back.  A sense of increased pelvic pressure or back  pain.  A watery or bloody mucus discharge that comes from the vagina. If you have any of these signs before the 37th week of pregnancy, call your caregiver right away. You need to go to the hospital to get checked immediately. HOME CARE INSTRUCTIONS   Avoid all smoking, herbs, alcohol, and unprescribed drugs. These chemicals affect the formation and growth of the baby.  Follow your caregiver's instructions regarding medicine use. There are medicines that are either safe or unsafe to take during pregnancy.  Exercise only as directed by your caregiver. Experiencing uterine cramps is a good sign to stop exercising.  Continue to eat regular, healthy meals.  Wear a good support bra for breast tenderness.  Do not use hot tubs, steam rooms, or saunas.  Wear your seat belt at all times when driving.  Avoid raw meat, uncooked cheese, cat litter boxes, and soil used by cats. These carry germs that can cause birth defects in the baby.  Take your prenatal vitamins.  Try taking a stool softener (if your caregiver approves) if you develop constipation. Eat more high-fiber foods, such as fresh vegetables or fruit and whole grains. Drink plenty of fluids to keep your urine clear or pale yellow.  Take warm sitz baths to soothe any pain or discomfort caused by hemorrhoids. Use hemorrhoid cream if your caregiver approves.  If you develop varicose veins, wear support hose. Elevate your feet for 15 minutes, 3-4 times a day. Limit salt in your diet.  Avoid heavy lifting, wear low heal shoes, and practice good posture.  Rest a lot with your legs elevated if you have leg cramps or low back pain.  Visit your dentist if you have not gone during your pregnancy. Use a soft toothbrush to brush your teeth and be gentle when you floss.  A sexual relationship may be continued unless your caregiver directs you otherwise.  Do not travel far distances unless it is absolutely necessary and only with the approval  of your caregiver.  Take prenatal classes to understand, practice, and ask questions about the labor and delivery.  Make a trial run to the hospital.  Pack your hospital bag.  Prepare the baby's nursery.  Continue to go to all your prenatal visits as directed by your caregiver. SEEK MEDICAL CARE IF:  You are unsure if you are in labor or if your water has broken.  You have dizziness.  You have mild pelvic cramps, pelvic pressure, or nagging pain in your abdominal area.  You have persistent nausea, vomiting, or diarrhea.  You have a bad smelling vaginal discharge.  You have pain with urination. SEEK IMMEDIATE MEDICAL CARE IF:   You have a fever.  You are leaking fluid from your vagina.  You have spotting or bleeding from your vagina.  You have severe abdominal cramping or pain.  You have rapid weight loss or gain.  You have shortness of breath with chest pain.  You notice sudden or extreme swelling   of your face, hands, ankles, feet, or legs.  You have not felt your baby move in over an hour.  You have severe headaches that do not go away with medicine.  You have vision changes. Document Released: 05/05/2001 Document Revised: 05/16/2013 Document Reviewed: 07/12/2012 [employer] Patient Information 2015 [employer]. This information is not intended to replace advice given to you by your health care provider. Make sure you discuss any questions you have with your health care provider.  

## 2013-12-08 NOTE — Telephone Encounter (Signed)
Terazol 7 sent to pharmacy at time of visit on 12/07/13.

## 2013-12-12 ENCOUNTER — Other Ambulatory Visit: Payer: Self-pay | Admitting: *Deleted

## 2013-12-12 DIAGNOSIS — B001 Herpesviral vesicular dermatitis: Secondary | ICD-10-CM

## 2013-12-12 MED ORDER — VALACYCLOVIR HCL 1 G PO TABS
1000.0000 mg | ORAL_TABLET | Freq: Two times a day (BID) | ORAL | Status: DC
Start: 2013-12-12 — End: 2013-12-23

## 2013-12-13 ENCOUNTER — Inpatient Hospital Stay (HOSPITAL_COMMUNITY)
Admission: AD | Admit: 2013-12-13 | Discharge: 2013-12-13 | Disposition: A | Discharge status: Home / Self Care | Payer: Medicaid Other | Source: Ambulatory Visit | Attending: Obstetrics | Admitting: Obstetrics

## 2013-12-13 ENCOUNTER — Encounter (HOSPITAL_COMMUNITY): Payer: Self-pay | Admitting: *Deleted

## 2013-12-13 DIAGNOSIS — O368190 Decreased fetal movements, unspecified trimester, not applicable or unspecified: Secondary | ICD-10-CM | POA: Diagnosis not present

## 2013-12-13 DIAGNOSIS — R002 Palpitations: Secondary | ICD-10-CM | POA: Insufficient documentation

## 2013-12-13 DIAGNOSIS — O3090 Multiple gestation, unspecified, unspecified trimester: Secondary | ICD-10-CM

## 2013-12-13 DIAGNOSIS — Z87891 Personal history of nicotine dependence: Secondary | ICD-10-CM | POA: Insufficient documentation

## 2013-12-13 DIAGNOSIS — O9989 Other specified diseases and conditions complicating pregnancy, childbirth and the puerperium: Secondary | ICD-10-CM

## 2013-12-13 DIAGNOSIS — O99891 Other specified diseases and conditions complicating pregnancy: Secondary | ICD-10-CM | POA: Insufficient documentation

## 2013-12-13 DIAGNOSIS — R011 Cardiac murmur, unspecified: Secondary | ICD-10-CM | POA: Insufficient documentation

## 2013-12-13 DIAGNOSIS — Z3689 Encounter for other specified antenatal screening: Secondary | ICD-10-CM

## 2013-12-13 DIAGNOSIS — O368131 Decreased fetal movements, third trimester, fetus 1: Secondary | ICD-10-CM

## 2013-12-13 NOTE — MAU Note (Signed)
Pt states she last felt the baby move earlier this morning at about 0900, prior to coming to the hospital . Pt states after nurse checked fetal heart rate she has been feeling the baby move

## 2013-12-13 NOTE — Discharge Instructions (Signed)

## 2013-12-13 NOTE — Telephone Encounter (Signed)
Patient called 12/12/2013 at 10:47AM wanting to know if it was safe for her to take Valtrex during pregnancy. Patient states if it is safe could she get and Rx sent to her pharmacy because she has a cold sore on her bottom lip. 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   Patient advised that it is safe to take Valtrex during her pregnancy and that we would send her an Rx to her Pharmacy and that she would take 1,000mg  PO BID for 3 days per Dr. Clearance CootsHarper.   Patient asked if she had ever had a vaginal outbreak patient states just the bump she currently has but that Dr. Tamela OddiJackson-Moore said she didn't need to treat it.  Patient notified that the vaginal bump she has is a Wart not Herpes and that they are two different things. Patient notified that if she has ever had vaginal herpes outbreak that she need to be on suppression. Patient voiced understanding.

## 2013-12-13 NOTE — MAU Note (Signed)
Pt reports decreased fetal movement for the last 24 hours. FHT's in triage 155, fetal movement noted then

## 2013-12-13 NOTE — MAU Provider Note (Signed)
Chief Complaint:  Decreased Fetal Movement   None     HPI: Krista Meza is a 23 y.o. G2P1001 at [redacted]w[redacted]d who presents to maternity admissions reporting decreased fetal movement today.  She reports she has felt a few movements today, but not as much for 24 hours as usual.  She also reports her cramping and lower abdominal pain she has every day is gone today, which made her worry.  She denies feeling contractions, LOF, vaginal bleeding, vaginal itching/burning, urinary symptoms, h/a, dizziness, n/v, or fever/chills.    Since arrival in MAU and application of monitors, pt feels normal fetal movement.   Past Medical History: Past Medical History  Diagnosis Date  . Heart palpitations   . Heart murmur     palpitations  . Asthma     as child, inhaler for a year  . Urinary tract infection   . Anxiety     Past obstetric history: OB History  Gravida Para Term Preterm AB SAB TAB Ectopic Multiple Living  2 1 1       1     # Outcome Date GA Lbr Len/2nd Weight Sex Delivery Anes PTL Lv  2 CUR           1 TRM 04/16/11 [redacted]w[redacted]d 15:35 / 05:35 3.12 kg (6 lb 14.1 oz) F LVCS EPI  Y      Past Surgical History: Past Surgical History  Procedure Laterality Date  . Cesarean section  04/16/2011    Procedure: CESAREAN SECTION;  Surgeon: Roseanna Rainbow, MD;  Location: [facility];  Service: Gynecology;  Laterality: N/A;  . Arm surgery Right     Family History: Family History  Problem Relation Age of Onset  . Anesthesia problems Neg Hx   . Hypotension Neg Hx   . Malignant hyperthermia Neg Hx   . Pseudochol deficiency Neg Hx     Social History: History  Substance Use Topics  . Smoking status: Former Smoker    Types: Cigars    Quit date: 04/08/2013  . Smokeless tobacco: Never Used  . Alcohol Use: No    Allergies:  Allergies  Allergen Reactions  . Amoxicillin Hives  . Penicillins Hives    Meds:  No prescriptions prior to admission    ROS: Pertinent findings in history of  present illness.  Physical Exam  Blood pressure 114/73, pulse 108, temperature 98.6 F (37 C), temperature source Oral, resp. rate 18, height 5' 1.5" (1.562 m), weight 85.276 kg (188 lb), last menstrual period 03/19/2013, SpO2 98.00%. GENERAL: Well-developed, well-nourished female in no acute distress.  HEENT: normocephalic HEART: normal rate RESP: normal effort ABDOMEN: Soft, non-tender, gravid appropriate for gestational age EXTREMITIES: Nontender, no edema NEURO: alert and oriented    FHT:  Baseline 145 , moderate variability, accelerations present, no decelerations Contractions: q 10-12 mins, mild to palpation   Assessment: 1. Decreased fetal movement, third trimester, fetus 1   2. NST (non-stress test) reactive     Plan: Discharge home Labor precautions and fetal kick counts Reassurance provided about NST.  Pt feeling good fetal movement at time of discharge.        Follow-up Information   Follow up with HARPER,CHARLES A, MD. (As scheduled)    Specialty:  Obstetrics and Gynecology   Contact information:   [address] [place] [address] [phone]       Follow up with [facility]. (As needed for emergencies)    Contact information:  [address] [id number] [place] [address] [phone]       Medication List         OB COMPLETE PETITE 35-5-1-200 MG Caps  Take 1 capsule by mouth daily.     terconazole 0.4 % vaginal cream  Commonly known as:  TERAZOL 7  Place 1 applicator vaginally at bedtime. For 14 days     valACYclovir 1000 MG tablet  Commonly known as:  VALTREX  Take 1 tablet (1,000 mg total) by mouth 2 (two) times daily.        Sharen CounterLisa Leftwich-Kirby Certified Nurse-Midwife 12/13/2013 10:18 PM

## 2013-12-14 ENCOUNTER — Encounter: Payer: Self-pay | Admitting: Obstetrics

## 2013-12-14 ENCOUNTER — Ambulatory Visit (INDEPENDENT_AMBULATORY_CARE_PROVIDER_SITE_OTHER): Payer: Medicaid Other | Admitting: Obstetrics

## 2013-12-14 VITALS — BP 107/71 | HR 89 | Wt 186.0 lb

## 2013-12-14 DIAGNOSIS — Z3483 Encounter for supervision of other normal pregnancy, third trimester: Secondary | ICD-10-CM

## 2013-12-14 DIAGNOSIS — Z3480 Encounter for supervision of other normal pregnancy, unspecified trimester: Secondary | ICD-10-CM

## 2013-12-14 NOTE — Progress Notes (Signed)
Subjective:    Krista Meza is a 23 y.o. female being seen today for her obstetrical visit. She is at 4554w4d gestation. Patient reports no complaints. Fetal movement: normal.  Problem List Items Addressed This Visit   None     Patient Active Problem List   Diagnosis Date Noted  . GBS (group B Streptococcus carrier), +RV culture, currently pregnant 12/06/2013  . Condyloma acuminata of vulva in pregnancy in third trimester 10/14/2013  . Obesity (BMI 30-39.9) 09/17/2013  . Previous cesarean section 05/08/2013  . Supervision of other normal pregnancy 05/08/2013  . Vaginitis and vulvovaginitis, unspecified 09/19/2012    Objective:    BP 107/71  Pulse 89  Wt 186 lb (84.369 kg)  LMP 03/19/2013 FHT: 140 BPM  Uterine Size: size equals dates  Presentations: unsure  Pelvic Exam: Deferred    Assessment:    Pregnancy @ 2254w4d weeks   Plan:   Plans for delivery: VBAC planned; labs reviewed; problem list updated Counseling: Consent signed. Infant feeding: plans to breastfeed. Cigarette smoking: quit date 11 / 2014. L&D discussion: symptoms of labor, discussed when to call, discussed what number to call, anesthetic/analgesic options reviewed and delivering clinician:  plans Physician. Postpartum supports and preparation: circumcision discussed and contraception plans discussed.  Follow up in 1 Week.

## 2013-12-18 ENCOUNTER — Ambulatory Visit (INDEPENDENT_AMBULATORY_CARE_PROVIDER_SITE_OTHER): Payer: Medicaid Other | Admitting: Obstetrics & Gynecology

## 2013-12-18 ENCOUNTER — Encounter: Payer: Self-pay | Admitting: Obstetrics & Gynecology

## 2013-12-18 VITALS — BP 121/74 | HR 109 | Temp 98.4°F | Wt 188.0 lb

## 2013-12-18 DIAGNOSIS — Z3480 Encounter for supervision of other normal pregnancy, unspecified trimester: Secondary | ICD-10-CM

## 2013-12-18 DIAGNOSIS — Z3483 Encounter for supervision of other normal pregnancy, third trimester: Secondary | ICD-10-CM

## 2013-12-18 LAB — POCT URINALYSIS DIPSTICK
Bilirubin, UA: NEGATIVE
Blood, UA: NEGATIVE
Glucose, UA: NEGATIVE
Ketones, UA: NEGATIVE
Nitrite, UA: NEGATIVE
PH UA: 7
Protein, UA: NEGATIVE
SPEC GRAV UA: 1.01
UROBILINOGEN UA: NEGATIVE

## 2013-12-18 NOTE — Progress Notes (Signed)
  Subjective:    Krista Meza is a 23 y.o. female being seen today for her obstetrical visit. She is at 309w1d  gestation. Patient reports no complaints. Fetal movement: normal.  Problem List Items Addressed This Visit     Unprioritized   Supervision of other normal pregnancy - Primary   Relevant Orders      POCT urinalysis dipstick     Patient Active Problem List   Diagnosis Date Noted  . GBS (group B Streptococcus carrier), +RV culture, currently pregnant 12/06/2013  . Condyloma acuminata of vulva in pregnancy in third trimester 10/14/2013  . Obesity (BMI 30-39.9) 09/17/2013  . Previous cesarean section 05/08/2013  . Supervision of other normal pregnancy 05/08/2013  . Vaginitis and vulvovaginitis, unspecified 09/19/2012  . Avitaminosis D 08/03/2011  . Chronic depression 07/31/2011  . Arthralgia of multiple joints 07/31/2011  . Intermittent pain 07/31/2011    Objective:    BP 121/74  Pulse 109  Temp(Src) 98.4 F (36.9 C)  Wt 85.276 kg (188 lb)  LMP 03/19/2013 FHT: 130 BPM  Uterine Size: size equals dates  Presentations: cephalic  Pelvic Exam: 3/60%  Membranes stripped  Assessment:    Pregnancy @ 319w1d weeks   Plan:   Plans for delivery: VBAC planned; labs reviewed; problem list updated Follow up in 1 Week.

## 2013-12-18 NOTE — Patient Instructions (Signed)
Pain Relief During Labor and Delivery Everyone experiences pain differently, but labor causes severe pain for many women. The amount of pain you experience during labor and delivery depends on your pain tolerance, contraction strength, and your baby's size and position. There are many ways to prepare for and deal with the pain, including:   Taking prenatal classes to learn about labor and delivery. The more informed you are, the less anxious and afraid you may be. This can help lessen the pain.  Taking pain-relieving medicine during labor and delivery.  Learning breathing and relaxation techniques.  Taking a shower or bath.  Getting massaged.  Changing positions.  Placing an ice pack on your back. Discuss your pain control options with your health care provider during your prenatal visits.  WHAT ARE THE TWO TYPES OF PAIN-RELIEVING MEDICINES? 1. Analgesics. These are medicines that decrease pain without total loss of feeling or muscle movement. 2. Anesthetics. These are medicines that block all feeling, including pain. There can be minor side effects of both types, such as nausea, trouble concentrating, becoming sleepy, and lowering the heart rate of the baby. However, health care providers are careful to give doses that will not seriously affect the baby.  WHAT ARE THE SPECIFIC TYPES OF ANALGESICS AND ANESTHETICS? Systemic Analgesic Systemic pain medicines affect your whole body rather than focusing pain relief on the area of your body experiencing pain. This type of medicine is given either through an IV tube in your vein or by a shot (injection) into your muscle. This medicine will lessen your pain but will not stop it completely. It may also make you sleepy, but it will not make you lose consciousness.  Local Anesthetic Local anesthetic isused tonumb a small area of your body. The medicine is injected into the area of nerves that carry feeling to the vagina, vulva, or the area between  the vagina and anus (perineum).  General Anesthetic This type of medicine causes you to lose consciousness so you do not feel pain. It is usually used only in emergency situations during labor. It is given through an IV tube or face mask. Paracervical Block A paracervical block is a form of local anesthesia given during labor. Numbing medicine is injected into the right and left sides of the cervix and vagina. It helps to lessen the pain caused by contractions and stretching of the cervix. It may have to be given more than once.  Pudendal Block A pudendal block is another form of local anesthesia. It is used to relieve the pain associated with pushing or stretching of the perineum at the time of delivery. An injection is given deep through the vaginal wall into the pudendal nerve in the pelvis, numbing the perineum.  Epidural Anesthetic An epidural is an injection of numbing medicine given in the lower back and into the epidural space near your spinal cord. The epidural numbs the lower half of your body. You may be able to move your legs but will not be allowed to walk. Epidurals can be used for labor, delivery, or cesarean deliveries.  To prevent the medicine from wearing off, a small tube (catheter) may be threaded into the epidural space and taped in place to prevent it from slipping out. Medicine can then be given continuously in small doses through the tube until you deliver. Spinal Block A spinal block is similar to an epidural, but the medicine is injected into the spinal fluid, not the epidural space. A spinal block is only given   once. It starts to relieve pain quickly but lasts only 1-2 hours. Spinal blocks can also be used for cesarean deliveries.  Combined Spinal-Epidural Block Combined spinal-epidural blocks combine the benefits of both the spinal and epidural blocks. The spinal part acts quickly to relieve pain and the epidural provides continuous pain relief. Hydrotherapy Immersion in  warm water during labor may provide comfort and relaxation. It may also help to lessen pain, the use of anesthesia, and the length of labor. However, immersion in water during the delivery (water birth) may have some risk involved and studies to determine safety and risks are ongoing. If you are a healthy woman who is expecting an uncomplicated birth, talk with your health care provider to see if water birth is an option for you.  Document Released: 08/27/2008 Document Revised: 05/16/2013 Document Reviewed: 09/29/2012 Exit[employer] Patient Information 2015 [employer]. This information is not intended to replace advice given to you by your health care provider. Make sure you discuss any questions you have with your health care provider.  

## 2013-12-20 ENCOUNTER — Encounter: Payer: Medicaid Other | Admitting: Obstetrics & Gynecology

## 2013-12-20 ENCOUNTER — Telehealth: Payer: Self-pay | Admitting: *Deleted

## 2013-12-20 NOTE — Telephone Encounter (Signed)
Pt called to office stating she had some questions and would like a return call. Return call to pt.  Pt states that she has been having some irregular ctx and has lost her mucous plug.  Pt states that she is concerned about when to go to hospital.  Pt advised of labor signs, ROM/ctx.  Pt states that she would like to know if she could still have intercourse.  Pt states that she will sometimes have increase in pressure after intercourse.  Pt advised that intercourse is fine as long as it is comfortable for her, that she may be having increase in pressure due to baby being low in pelvis.  Pt states understanding of what was discussed. Pt advised to contact office if she has any further concerns.

## 2013-12-22 ENCOUNTER — Encounter (HOSPITAL_COMMUNITY): Payer: Self-pay

## 2013-12-22 ENCOUNTER — Inpatient Hospital Stay (HOSPITAL_COMMUNITY)
Admission: AD | Admit: 2013-12-22 | Discharge: 2013-12-25 | DRG: 775 | Disposition: A | Discharge status: Home / Self Care | Payer: Medicaid Other | Source: Ambulatory Visit | Attending: Obstetrics | Admitting: Obstetrics

## 2013-12-22 DIAGNOSIS — O99344 Other mental disorders complicating childbirth: Secondary | ICD-10-CM | POA: Diagnosis present

## 2013-12-22 DIAGNOSIS — E669 Obesity, unspecified: Secondary | ICD-10-CM | POA: Diagnosis present

## 2013-12-22 DIAGNOSIS — O9902 Anemia complicating childbirth: Secondary | ICD-10-CM | POA: Diagnosis present

## 2013-12-22 DIAGNOSIS — Z87891 Personal history of nicotine dependence: Secondary | ICD-10-CM

## 2013-12-22 DIAGNOSIS — O99820 Streptococcus B carrier state complicating pregnancy: Secondary | ICD-10-CM

## 2013-12-22 DIAGNOSIS — O2210 Genital varices in pregnancy, unspecified trimester: Secondary | ICD-10-CM | POA: Diagnosis present

## 2013-12-22 DIAGNOSIS — Z98891 History of uterine scar from previous surgery: Secondary | ICD-10-CM

## 2013-12-22 DIAGNOSIS — Z22330 Carrier of Group B streptococcus: Secondary | ICD-10-CM

## 2013-12-22 DIAGNOSIS — O9989 Other specified diseases and conditions complicating pregnancy, childbirth and the puerperium: Secondary | ICD-10-CM

## 2013-12-22 DIAGNOSIS — D649 Anemia, unspecified: Secondary | ICD-10-CM | POA: Diagnosis present

## 2013-12-22 DIAGNOSIS — O479 False labor, unspecified: Secondary | ICD-10-CM | POA: Diagnosis present

## 2013-12-22 DIAGNOSIS — O99892 Other specified diseases and conditions complicating childbirth: Secondary | ICD-10-CM | POA: Diagnosis present

## 2013-12-22 DIAGNOSIS — O34219 Maternal care for unspecified type scar from previous cesarean delivery: Secondary | ICD-10-CM | POA: Diagnosis present

## 2013-12-22 DIAGNOSIS — F341 Dysthymic disorder: Secondary | ICD-10-CM | POA: Diagnosis present

## 2013-12-22 DIAGNOSIS — A630 Anogenital (venereal) warts: Secondary | ICD-10-CM

## 2013-12-22 DIAGNOSIS — Z6837 Body mass index (BMI) 37.0-37.9, adult: Secondary | ICD-10-CM | POA: Diagnosis not present

## 2013-12-22 DIAGNOSIS — O98313 Other infections with a predominantly sexual mode of transmission complicating pregnancy, third trimester: Secondary | ICD-10-CM

## 2013-12-22 DIAGNOSIS — Z3483 Encounter for supervision of other normal pregnancy, third trimester: Secondary | ICD-10-CM

## 2013-12-22 DIAGNOSIS — O99214 Obesity complicating childbirth: Secondary | ICD-10-CM

## 2013-12-22 DIAGNOSIS — IMO0001 Reserved for inherently not codable concepts without codable children: Secondary | ICD-10-CM

## 2013-12-22 LAB — CBC
HCT: 35.3 % — ABNORMAL LOW (ref 36.0–46.0)
Hemoglobin: 11.7 g/dL — ABNORMAL LOW (ref 12.0–15.0)
MCH: 29.6 pg (ref 26.0–34.0)
MCHC: 33.1 g/dL (ref 30.0–36.0)
MCV: 89.4 fL (ref 78.0–100.0)
PLATELETS: 278 10*3/uL (ref 150–400)
RBC: 3.95 MIL/uL (ref 3.87–5.11)
RDW: 14.2 % (ref 11.5–15.5)
WBC: 8.3 10*3/uL (ref 4.0–10.5)

## 2013-12-22 LAB — POCT FERN TEST: POCT Fern Test: POSITIVE

## 2013-12-22 MED ORDER — FENTANYL 2.5 MCG/ML BUPIVACAINE 1/10 % EPIDURAL INFUSION (WH - ANES)
14.0000 mL/h | INTRAMUSCULAR | Status: DC | PRN
  Administered 2013-12-23: 14 mL/h via EPIDURAL
  Filled 2013-12-22: qty 125

## 2013-12-22 MED ORDER — PHENYLEPHRINE 40 MCG/ML (10ML) SYRINGE FOR IV PUSH (FOR BLOOD PRESSURE SUPPORT)
80.0000 ug | PREFILLED_SYRINGE | INTRAVENOUS | Status: DC | PRN
  Filled 2013-12-22: qty 10

## 2013-12-22 MED ORDER — ACETAMINOPHEN 325 MG PO TABS
650.0000 mg | ORAL_TABLET | ORAL | Status: DC | PRN
Start: 2013-12-22 — End: 2013-12-25

## 2013-12-22 MED ORDER — EPHEDRINE 5 MG/ML INJ: 10.0000 mg | INTRAVENOUS | Status: DC | PRN

## 2013-12-22 MED ORDER — LIDOCAINE HCL (PF) 1 % IJ SOLN
30.0000 mL | INTRAMUSCULAR | Status: AC | PRN
  Administered 2013-12-23 (×2): 5 mL via SUBCUTANEOUS

## 2013-12-22 MED ORDER — CITRIC ACID-SODIUM CITRATE 334-500 MG/5ML PO SOLN: 30.0000 mL | ORAL | Status: DC | PRN

## 2013-12-22 MED ORDER — BUTORPHANOL TARTRATE 1 MG/ML IJ SOLN
1.0000 mg | INTRAMUSCULAR | Status: DC | PRN
  Administered 2013-12-22: 1 mg via INTRAVENOUS
  Filled 2013-12-22: qty 1

## 2013-12-22 MED ORDER — LACTATED RINGERS IV SOLN: 500.0000 mL | INTRAVENOUS | Status: DC | PRN

## 2013-12-22 MED ORDER — LACTATED RINGERS IV SOLN
500.0000 mL | Freq: Once | INTRAVENOUS | Status: AC
  Administered 2013-12-22: 500 mL via INTRAVENOUS

## 2013-12-22 MED ORDER — ONDANSETRON HCL 4 MG/2ML IJ SOLN: 4.0000 mg | Freq: Four times a day (QID) | INTRAMUSCULAR | Status: DC | PRN

## 2013-12-22 MED ORDER — OXYCODONE-ACETAMINOPHEN 5-325 MG PO TABS: 1.0000 | ORAL_TABLET | ORAL | Status: DC | PRN

## 2013-12-22 MED ORDER — DIPHENHYDRAMINE HCL 50 MG/ML IJ SOLN: 12.5000 mg | INTRAMUSCULAR | Status: DC | PRN

## 2013-12-22 MED ORDER — IBUPROFEN 600 MG PO TABS
600.0000 mg | ORAL_TABLET | Freq: Four times a day (QID) | ORAL | Status: DC | PRN
  Administered 2013-12-23 (×2): 600 mg via ORAL
  Filled 2013-12-22: qty 1

## 2013-12-22 MED ORDER — OXYTOCIN BOLUS FROM INFUSION: 500.0000 mL | INTRAVENOUS | Status: DC

## 2013-12-22 MED ORDER — CLINDAMYCIN PHOSPHATE 900 MG/50ML IV SOLN
900.0000 mg | Freq: Three times a day (TID) | INTRAVENOUS | Status: DC
  Administered 2013-12-22: 900 mg via INTRAVENOUS
  Filled 2013-12-22 (×3): qty 50

## 2013-12-22 MED ORDER — LACTATED RINGERS IV SOLN
INTRAVENOUS | Status: DC
  Administered 2013-12-22 – 2013-12-23 (×2): via INTRAVENOUS

## 2013-12-22 MED ORDER — OXYTOCIN 40 UNITS IN LACTATED RINGERS INFUSION - SIMPLE MED
62.5000 mL/h | INTRAVENOUS | Status: DC
  Filled 2013-12-22: qty 1000

## 2013-12-22 MED ORDER — FLEET ENEMA 7-19 GM/118ML RE ENEM
1.0000 | ENEMA | RECTAL | Status: DC | PRN
Start: 2013-12-22 — End: 2013-12-23

## 2013-12-22 MED ORDER — PHENYLEPHRINE 40 MCG/ML (10ML) SYRINGE FOR IV PUSH (FOR BLOOD PRESSURE SUPPORT): 80.0000 ug | PREFILLED_SYRINGE | INTRAVENOUS | Status: DC | PRN

## 2013-12-22 NOTE — MAU Note (Signed)
Report called to Dana RN in BS.  

## 2013-12-22 NOTE — MAU Note (Signed)
Patient presents with contractions back to back, leaking clear fluid since 10 pm. SVE 5/80/-2

## 2013-12-23 ENCOUNTER — Inpatient Hospital Stay (HOSPITAL_COMMUNITY): Payer: Medicaid Other | Admitting: Anesthesiology

## 2013-12-23 ENCOUNTER — Encounter (HOSPITAL_COMMUNITY): Payer: Self-pay | Admitting: *Deleted

## 2013-12-23 ENCOUNTER — Encounter (HOSPITAL_COMMUNITY): Payer: Medicaid Other | Admitting: Anesthesiology

## 2013-12-23 LAB — RPR

## 2013-12-23 LAB — CBC
HCT: 29.8 % — ABNORMAL LOW (ref 36.0–46.0)
HEMOGLOBIN: 9.8 g/dL — AB (ref 12.0–15.0)
MCH: 29.5 pg (ref 26.0–34.0)
MCHC: 32.9 g/dL (ref 30.0–36.0)
MCV: 89.8 fL (ref 78.0–100.0)
Platelets: 228 10*3/uL (ref 150–400)
RBC: 3.32 MIL/uL — ABNORMAL LOW (ref 3.87–5.11)
RDW: 14 % (ref 11.5–15.5)
WBC: 11.4 10*3/uL — ABNORMAL HIGH (ref 4.0–10.5)

## 2013-12-23 LAB — TYPE AND SCREEN
ABO/RH(D): O POS
ANTIBODY SCREEN: NEGATIVE

## 2013-12-23 LAB — ABO/RH: ABO/RH(D): O POS

## 2013-12-23 MED ORDER — ONDANSETRON HCL 4 MG PO TABS: 4.0000 mg | ORAL_TABLET | ORAL | Status: DC | PRN

## 2013-12-23 MED ORDER — FERROUS SULFATE 325 (65 FE) MG PO TABS
325.0000 mg | ORAL_TABLET | Freq: Two times a day (BID) | ORAL | Status: DC
  Administered 2013-12-23 – 2013-12-25 (×5): 325 mg via ORAL
  Filled 2013-12-23 (×6): qty 1

## 2013-12-23 MED ORDER — DIPHENHYDRAMINE HCL 25 MG PO CAPS: 25.0000 mg | ORAL_CAPSULE | Freq: Four times a day (QID) | ORAL | Status: DC | PRN

## 2013-12-23 MED ORDER — DIBUCAINE 1 % RE OINT
1.0000 "application " | TOPICAL_OINTMENT | RECTAL | Status: DC | PRN
  Filled 2013-12-23: qty 28

## 2013-12-23 MED ORDER — BENZOCAINE-MENTHOL 20-0.5 % EX AERO
1.0000 "application " | INHALATION_SPRAY | CUTANEOUS | Status: DC | PRN
  Administered 2013-12-23: 1 via TOPICAL
  Filled 2013-12-23 (×2): qty 56

## 2013-12-23 MED ORDER — OXYCODONE-ACETAMINOPHEN 5-325 MG PO TABS: 1.0000 | ORAL_TABLET | ORAL | Status: DC | PRN

## 2013-12-23 MED ORDER — IBUPROFEN 600 MG PO TABS
600.0000 mg | ORAL_TABLET | Freq: Four times a day (QID) | ORAL | Status: DC
  Administered 2013-12-23 – 2013-12-25 (×8): 600 mg via ORAL
  Filled 2013-12-23 (×9): qty 1

## 2013-12-23 MED ORDER — ZOLPIDEM TARTRATE 5 MG PO TABS: 5.0000 mg | ORAL_TABLET | Freq: Every evening | ORAL | Status: DC | PRN

## 2013-12-23 MED ORDER — LANOLIN HYDROUS EX OINT: TOPICAL_OINTMENT | CUTANEOUS | Status: DC | PRN

## 2013-12-23 MED ORDER — WITCH HAZEL-GLYCERIN EX PADS: 1.0000 "application " | MEDICATED_PAD | CUTANEOUS | Status: DC | PRN

## 2013-12-23 MED ORDER — PRENATAL MULTIVITAMIN CH
1.0000 | ORAL_TABLET | Freq: Every day | ORAL | Status: DC
  Administered 2013-12-23 – 2013-12-25 (×3): 1 via ORAL
  Filled 2013-12-23 (×3): qty 1

## 2013-12-23 MED ORDER — TETANUS-DIPHTH-ACELL PERTUSSIS 5-2.5-18.5 LF-MCG/0.5 IM SUSP: 0.5000 mL | Freq: Once | INTRAMUSCULAR | Status: DC

## 2013-12-23 MED ORDER — SIMETHICONE 80 MG PO CHEW: 80.0000 mg | CHEWABLE_TABLET | ORAL | Status: DC | PRN

## 2013-12-23 MED ORDER — ONDANSETRON HCL 4 MG/2ML IJ SOLN: 4.0000 mg | INTRAMUSCULAR | Status: DC | PRN

## 2013-12-23 MED ORDER — SENNOSIDES-DOCUSATE SODIUM 8.6-50 MG PO TABS
2.0000 | ORAL_TABLET | ORAL | Status: DC
  Administered 2013-12-24 – 2013-12-25 (×2): 2 via ORAL
  Filled 2013-12-23 (×2): qty 2

## 2013-12-23 NOTE — Anesthesia Preprocedure Evaluation (Signed)
Anesthesia Evaluation  Patient identified by MRN, date of birth, ID band Patient awake    Reviewed: Allergy & Precautions, H&P , NPO status , Patient's Chart, lab work & pertinent test results  History of Anesthesia Complications Negative for: history of anesthetic complications  Airway Mallampati: II TM Distance: >3 FB Neck ROM: Full    Dental   Pulmonary neg sleep apnea, neg COPDneg recent URI, former smoker,          Cardiovascular negative cardio ROS  Rhythm:Regular     Neuro/Psych PSYCHIATRIC DISORDERS Anxiety negative neurological ROS     GI/Hepatic negative GI ROS, Neg liver ROS,   Endo/Other  Morbid obesity  Renal/GU negative Renal ROS     Musculoskeletal   Abdominal   Peds  Hematology  (+) anemia ,   Anesthesia Other Findings   Reproductive/Obstetrics                           Anesthesia Physical Anesthesia Plan  ASA: II  Anesthesia Plan: Epidural   Post-op Pain Management:    Induction:   Airway Management Planned:   Additional Equipment:   Intra-op Plan:   Post-operative Plan:   Informed Consent: I have reviewed the patients History and Physical, chart, labs and discussed the procedure including the risks, benefits and alternatives for the proposed anesthesia with the patient or authorized representative who has indicated his/her understanding and acceptance.   Dental advisory given  Plan Discussed with: Anesthesiologist  Anesthesia Plan Comments:         Anesthesia Quick Evaluation

## 2013-12-23 NOTE — Anesthesia Postprocedure Evaluation (Signed)
Anesthesia Post Note  Patient: Krista Meza  Procedure(s) Performed: * No procedures listed *  Anesthesia type: Epidural  Patient location: Mother/Baby  Post pain: Pain level controlled  Post assessment: Post-op Vital signs reviewed  Last Vitals:  Filed Vitals:   12/23/13 0615  BP: 104/59  Pulse: 57  Temp: 36.7 C  Resp: 18    Post vital signs: Reviewed  Level of consciousness:alert  Complications: No apparent anesthesia complications

## 2013-12-23 NOTE — Progress Notes (Signed)
Patient ID: Lucky RathkeKhalilah V Meza, female   DOB: 1991/04/21, 23 y.o.   MRN: [id number] Postpartum day 0 Vital signs normal Lochia moderate Doing well

## 2013-12-23 NOTE — H&P (Signed)
This is Dr. Francoise CeoBernard Nakea Gouger dictating the history and physical on  Krista Meza she's a 23 year old gravida 2 para 1001 who had a C-section in the past she is a positive GBS treated with clindamycin and she's 39 weeks and 6 days  admitted in labor 7 cm in labor and delivery she progressed slowly   of a female Apgar 8 and 9 placenta was spontaneous she was noted to have a   labia  varicoties  and she had a tear 2 left side of the vagina and  . The interrupted sutures of #1 chromic she also had a right sulcus tear which was repaired with 2-0 Vicryl past medical history negative from a C-section past surgical history C-section in the past for failure to progress Social history negative System review negative Physical exam obese female in no distress HEENT negative Lungs clear to P&A Heart regular rhythm no murmurs no gallops Uterus 20 week postpartum size Pelvic as described above  Extremities negative

## 2013-12-23 NOTE — Progress Notes (Signed)
Clinical Social Work Department PSYCHOSOCIAL ASSESSMENT - MATERNAL/CHILD 12/23/2013  Patient:  Krista Meza,Krista Meza  Account Number:  [id number]  Admit Date:  12/22/2013  Marjo Bickerhilds Name:   Krista AlexandriaKarmen Isabelle Meza    Clinical Social Worker:  Lexys Milliner, LCSW   Date/Time:  12/23/2013 11:00 AM  Date Referred:  12/23/2013   Referral source  Central Nursery     Referred reason  Depression/Anxiety   Other referral source:    I:  FAMILY / HOME ENVIRONMENT Child's legal guardian:  PARENT  Guardian - Name Guardian - Age Guardian - Address  Krista Meza,Krista Meza 23 [address]  [place], [place] [address]  America Brown[place], Krista 39    Other household support members/support persons Other support:    II  PSYCHOSOCIAL DATA Information Source:    Event organiserinancial and Community Resources Employment:   Father is employed as a Engineer, siteschool teacher  Mother is a full time Product/process development scientiststudent   Financial resources:  [employer]icaid If [place] [place] - [place]:   Other  Allstate[employer]IC  Chemical engineerood Stamps   School / Grade:   Maternity Care Coordinator / Child Services Coordination / Early Interventions:  Cultural issues impacting care:    III  STRENGTHS Strengths  Supportive family/friends  Home prepared for Child (including basic supplies)  Adequate Resources   Strength comment:    IV  RISK FACTORS AND CURRENT PROBLEMS Current Problem:       Meza  SOCIAL WORK ASSESSMENT Acknowledged order for Social Work consult to assess mother's history of anxiety.  Parents are married.  Mother states that they are married, but separated and working on getting back together.  They have one other dependent age 283.  Mother acknowledged hx of anxiety and depression. Informed that her father has hx of bipolar and anxiety. She reports that at age 23 she was treated for anxiety and depression and started on medication which she took only for a short period of time.  Informed that she was in denial at the time and was not interested in therapy of medication.  Patient  states that she suffered from really bad panic attacks with previous pregnancy that lasted for up to two hours and were as frequent as several times a day.  Patient states that she started counseling about a month before finding out about the pregnancy and started on celexa, which she stated was effective, but stopped due to the pregnancy.  She communicate [facility] restarting medication and therapy.   Mother states that the severity and frequency of the panic attacks significantly declined during this pregnancy.  She denies current symptoms of depression or anxiety. She was very open about her mental health history.   She denies any hx of substance abuse. Discussed signs and symptoms of PP Depression.  No acute social concerns noted at this time.  Mother informed of social work Surveyor, miningavailability.      VI SOCIAL WORK PLAN Social Work Plan  No Further Intervention Required / No Barriers to Discharge

## 2013-12-23 NOTE — Anesthesia Procedure Notes (Signed)
Epidural Patient location during procedure: OB Start time: 12/22/2013 11:57 PM End time: 12/23/2013 12:07 AM  Staffing Anesthesiologist: Sophiagrace Benbrook, CHRIS Performed by: anesthesiologist   Preanesthetic Checklist Completed: patient identified, surgical consent, pre-op evaluation, timeout performed, IV checked, risks and benefits discussed and monitors and equipment checked  Epidural Patient position: sitting Prep: site prepped and draped and DuraPrep Patient monitoring: heart rate, cardiac monitor, continuous pulse ox and blood pressure Approach: midline Location: L3-L4 Injection technique: LOR saline  Needle:  Needle type: Tuohy  Needle gauge: 17 G Needle length: 9 cm Needle insertion depth: 7 cm Catheter type: closed end flexible Catheter size: 19 Gauge Catheter at skin depth: 13 cm Test dose: Other  Assessment Events: blood not aspirated, injection not painful, no injection resistance, negative IV test and no paresthesia  Additional Notes H+P and labs checked, risks and benefits discussed with the patient, consent obtained, procedure tolerated well and without complications.  Reason for block:procedure for pain

## 2013-12-23 NOTE — H&P (Signed)
Addendum the placenta was  -spontaneously intact

## 2013-12-24 NOTE — Progress Notes (Signed)
Patient ID: Krista Meza, female   DOB: 1990/06/21, 23 y.o.   MRN: [id number] Postpartum day one Vital signs normal Fundus firm Lochia moderate Doing well

## 2013-12-24 NOTE — Lactation Note (Addendum)
This note was copied from the chart of Krista Meza. Lactation Consultation Note Mom c/o pain right nipple. States baby cracked the nipple last night and she has not fed or pumped on that side in about 16 hours. Crack is healing well and closing. Mom is using lanolin per her preference.  Instructed mom to decrease the strength of the pump so it is more comfortable, and to lubricate the flange with lanolin. Mom does not want to latch baby at this time; states baby just fed 45 minutes ago, and mom's dinner just arrived and she wants to eat.  Feeding plan: Frequent STS and cue based feeding. Continue to attempt latch, if too painful, pump. If that is too painful, hand express.  Feed baby whatever breastmilk is pumped.  Also discussed delaying paci use until breastfeeding is going better.  Enc mom to call for help if needed.    Patient Name: Krista Meza ZOXWR'UToday's Date: 12/24/2013     Maternal Data    Feeding Feeding Type: Breast Fed Length of feed: 5 min  LATCH Score/Interventions                      Lactation Tools Discussed/Used     Consult Status      Lenard ForthSanders, Jashawn Floyd Fulmer 12/24/2013, 3:42 PM

## 2013-12-25 MED ORDER — OXYCODONE-ACETAMINOPHEN 5-325 MG PO TABS: 1.0000 | ORAL_TABLET | ORAL | Status: DC | PRN

## 2013-12-25 MED ORDER — IBUPROFEN 600 MG PO TABS: 600.0000 mg | ORAL_TABLET | Freq: Four times a day (QID) | ORAL | Status: DC | PRN

## 2013-12-25 NOTE — Discharge Instructions (Signed)

## 2013-12-25 NOTE — Progress Notes (Signed)
Post Partum Day 2 Subjective: no complaints  Objective: Blood pressure 109/70, pulse 93, temperature 98.4 F (36.9 C), temperature source Oral, resp. rate 18, height 5\' 1"  (1.549 m), weight 197 lb (89.359 kg), last menstrual period 03/19/2013, SpO2 100.00%, unknown if currently breastfeeding.  Physical Exam:  General: alert and no distress Lochia: appropriate Uterine Fundus: firm Incision: none DVT Evaluation: No evidence of DVT seen on physical exam.   Recent Labs  12/22/13 2230 12/23/13 0519  HGB 11.7* 9.8*  HCT 35.3* 29.8*    Assessment/Plan: Discharge home   LOS: 3 days   Nickolaos Brallier A 12/25/2013, 8:54 AM

## 2013-12-25 NOTE — Lactation Note (Signed)
This note was copied from the chart of Krista Peggyann ShoalsKhalilah Steinhaus. Lactation Consultation Note Mom states latching baby is still difficult, and she continues to try. She is pumping as well, and getting more volume. Mom feeds pumped breast milk to baby via bottle, supplemented by formula.  Encouraged mom to continue trying to latch baby, and to call lactation office for assistance if needed.  Encouraged mom to call the lactation office if she has any concerns, and to attend the BFSG. Patient Name: Krista Meza     Maternal Data    Feeding Feeding Type: Bottle Fed - Breast Milk  LATCH Score/Interventions                      Lactation Tools Discussed/Used     Consult Status      Lenard ForthSanders, Thermon Zulauf Fulmer Meza, 10:51 AM

## 2013-12-25 NOTE — Discharge Summary (Signed)
Obstetric Discharge Summary Reason for Admission: onset of labor Prenatal Procedures: ultrasound Intrapartum Procedures: spontaneous vaginal delivery Postpartum Procedures: none Complications-Operative and Postpartum: none Hemoglobin  Date Value Ref Range Status  12/23/2013 9.8* 12.0 - 15.0 g/dL Final     HCT  Date Value Ref Range Status  12/23/2013 29.8* 36.0 - 46.0 % Final    Physical Exam:  General: alert and no distress Lochia: appropriate Uterine Fundus: firm Incision: none DVT Evaluation: No evidence of DVT seen on physical exam.  Discharge Diagnoses: Term Pregnancy-delivered  Discharge Information: Date: 12/25/2013 Activity: pelvic rest Diet: routine Medications: PNV, Ibuprofen, Colace and Percocet Condition: stable Instructions: refer to practice specific booklet Discharge to: home Follow-up Information   Follow up with [name],[name], MD In 2 weeks.   Specialty:  Obstetrics and Gynecology   Contact information:   [address] [place] [address] [phone]       Newborn Data: Live born female  Birth Weight: 6 lb 12.3 oz (3070 g) APGAR: 8, 9  Home with mother.  Krista Meza,Krista Meza 12/25/2013, 9:06 AM

## 2013-12-26 ENCOUNTER — Encounter: Payer: Medicaid Other | Admitting: Obstetrics

## 2014-01-01 ENCOUNTER — Telehealth: Payer: Self-pay | Admitting: *Deleted

## 2014-01-01 NOTE — Telephone Encounter (Signed)
Pt placed call to office regarding medication refill. Return call to pt.  Pt states that she was taking Celexa prior to pregnancy/delivery.  Pt states that she would like to know if it is safe to take while breastfeeding and if she could get a Rx for it.  Pt states that she does have a PP visit scheduled for next week.   Pt advised that her request would be sent to her physician for review.   Please review and advise.

## 2014-01-08 ENCOUNTER — Encounter: Payer: Medicaid Other | Admitting: Obstetrics & Gynecology

## 2014-01-11 ENCOUNTER — Telehealth: Payer: Self-pay | Admitting: *Deleted

## 2014-01-11 ENCOUNTER — Ambulatory Visit (INDEPENDENT_AMBULATORY_CARE_PROVIDER_SITE_OTHER): Payer: Medicaid Other | Admitting: Obstetrics & Gynecology

## 2014-01-11 ENCOUNTER — Encounter: Payer: Self-pay | Admitting: Obstetrics & Gynecology

## 2014-01-11 MED ORDER — FLUCONAZOLE 150 MG PO TABS: 150.0000 mg | ORAL_TABLET | ORAL | Status: DC

## 2014-01-11 NOTE — Addendum Note (Signed)
Addended by: [name], Azariya Freeman on: 01/11/2014 04:24 PM   Modules accepted: Orders

## 2014-01-11 NOTE — Progress Notes (Signed)
Patient ID: Krista Meza, female   DOB: 12/07/1990, 23 y.o.   MRN: [id number] Subjective:     Krista Meza is a 23 y.o. female who presents for a postpartum visit. She is 3 weeks postpartum following a spontaneous vaginal delivery. I have fully reviewed the prenatal and intrapartum course. The delivery was at term. Outcome: spontaneous vaginal delivery. Anesthesia: epidural. Postpartum course has been unremarkable. Baby's course has been normal. Baby is feeding by breast/pump. Bleeding no bleeding. Bowel function is normal. Bladder function is normal. Patient is not sexually active. Contraception method is abstinence. Postpartum depression screening: negative.  Tobacco, alcohol and substance abuse history reviewed.  Adult immunizations reviewed including TDAP, rubella and varicella.  The following portions of the patient's history were reviewed and updated as appropriate: allergies, current medications, past family history, past medical history, past social history, past surgical history and problem list.  Review of Systems Pertinent items are noted in HPI.   Objective:    BP 112/78  Temp(Src) 98.7 F (37.1 C)  Ht 5\' 2"  (1.575 m)  Wt 82.101 kg (181 lb)  BMI 33.10 kg/m2  Breastfeeding? Yes          50% of 15 min visit spent on counseling and coordination of care.  Assessment:     Normal postpartum exam Plan:    1. Contraception: plans Nexplanon Follow up in: several weeks or as needed.  Healthy lifestyle practices reviewed

## 2014-01-11 NOTE — Telephone Encounter (Signed)
Patient states she was in the office today and for got to ask a question. Patient states she was previously on Celexa prior to pregnancy and wants to know if it is safe for breastfeeding. Patient states if it is can she have a prescription for it. Patient state she is starting to feel like she is having some depression.

## 2014-01-14 NOTE — Telephone Encounter (Signed)
Celexa is compatible with breast feeding--OK to call in the pt's previous dose.  Zoloft or Paxil is preferred.

## 2014-01-16 ENCOUNTER — Other Ambulatory Visit: Payer: Self-pay | Admitting: *Deleted

## 2014-01-16 MED ORDER — CITALOPRAM HYDROBROMIDE 20 MG PO TABS: 20.0000 mg | ORAL_TABLET | Freq: Every day | ORAL | Status: DC

## 2014-01-17 NOTE — Telephone Encounter (Signed)
Patient advised and prescription sent to the pharmacy 

## 2014-02-12 ENCOUNTER — Ambulatory Visit (INDEPENDENT_AMBULATORY_CARE_PROVIDER_SITE_OTHER): Payer: Medicaid Other | Admitting: Obstetrics & Gynecology

## 2014-02-12 VITALS — BP 109/69 | HR 91 | Wt 195.0 lb

## 2014-02-12 DIAGNOSIS — Z30017 Encounter for initial prescription of implantable subdermal contraceptive: Secondary | ICD-10-CM

## 2014-02-12 DIAGNOSIS — Z01818 Encounter for other preprocedural examination: Secondary | ICD-10-CM

## 2014-02-14 ENCOUNTER — Encounter: Payer: Self-pay | Admitting: Obstetrics & Gynecology

## 2014-02-14 MED ORDER — ETONOGESTREL 68 MG ~~LOC~~ IMPL: 1.0000 | DRUG_IMPLANT | Freq: Once | SUBCUTANEOUS | Status: DC

## 2014-02-14 NOTE — Progress Notes (Signed)
NEXPLANON INSERTION NOTE  BP 109/69  Pulse 91  Wt 88.451 kg (195 lb)   Pregnancy test result:  Lab Results  Component Value Date   PREGTESTUR NEGATIVE 08/31/2012   HCG 13393.0 07/17/2013    Indications:  The patient desires contraception.  She understands risks, benefits, and alternatives to Implanon and would like to proceed.  Anesthesia:   Lidocaine 1% plain.  Procedure:  A time-out was performed confirming the procedure and the patient's allergy status.  The patient's non-dominant was identified as the left arm.  The protection cap was removed. While placing countertraction on the skin, the needle was inserted at a 30 degree angle.  The applicator was held horizontal to the skin; the skin was tented upward as the needle was introduced into the subdermal space.  While holding the applicator in place, the slider was unlocked. The Nexplanon was removed from the field.  The Nexplanon was palpated to ensure proper placement.  A 3-0 Vicryl suture was placed for hemostasis.  Complications: None  Instructions:  The patient was instructed to remove the dressing in 24 hours and that some bruising is to be expected.  She was advised to use over the counter analgesics as needed for any pain at the site.  She is to keep the area dry for 24 hours and to call if her hand or arm becomes cold, numb, or blue.  Return visit:  Return prn.

## 2014-02-22 ENCOUNTER — Telehealth: Payer: Self-pay | Admitting: *Deleted

## 2014-02-22 NOTE — Telephone Encounter (Signed)
Patient called to schedule an appointment to have a stitch removed. Patient had a Nexplanon inserted on 02-12-14 and received the stitch at that appointment. Patient states it has not dissloved. Patient advised to come on 03-01-14 @ 1pm

## 2014-03-01 ENCOUNTER — Encounter: Payer: Self-pay | Admitting: Obstetrics & Gynecology

## 2014-03-01 ENCOUNTER — Ambulatory Visit (INDEPENDENT_AMBULATORY_CARE_PROVIDER_SITE_OTHER): Payer: Medicaid Other | Admitting: Obstetrics & Gynecology

## 2014-03-01 VITALS — BP 119/73 | HR 79 | Temp 97.4°F | Ht 61.5 in | Wt 198.0 lb

## 2014-03-01 DIAGNOSIS — Z09 Encounter for follow-up examination after completed treatment for conditions other than malignant neoplasm: Secondary | ICD-10-CM

## 2014-03-01 DIAGNOSIS — Z3049 Encounter for surveillance of other contraceptives: Secondary | ICD-10-CM

## 2014-03-04 NOTE — Progress Notes (Signed)
Patient ID: Krista Meza, female   DOB: 1991/01/12, 23 y.o.   MRN: [id number]  Chief Complaint  Patient presents with  . stitch removal    HPI Krista Meza is a 23 y.o. female.   HPI  Past Medical History  Diagnosis Date  . Heart palpitations   . Heart murmur     palpitations  . Asthma     as child, inhaler for a year  . Urinary tract infection   . Anxiety     Past Surgical History  Procedure Laterality Date  . Cesarean section  04/16/2011    Procedure: CESAREAN SECTION;  Surgeon: Roseanna RainbowLisa A Jackson-Moore, MD;  Location: [facility];  Service: Gynecology;  Laterality: N/A;  . Arm surgery Right     Family History  Problem Relation Age of Onset  . Anesthesia problems Neg Hx   . Hypotension Neg Hx   . Malignant hyperthermia Neg Hx   . Pseudochol deficiency Neg Hx     Social History History  Substance Use Topics  . Smoking status: Former Smoker    Types: Cigars    Quit date: 04/08/2013  . Smokeless tobacco: Never Used  . Alcohol Use: No    Allergies  Allergen Reactions  . Amoxicillin Hives  . Penicillins Hives    Current Outpatient Prescriptions  Medication Sig Dispense Refill  . citalopram (CELEXA) 20 MG tablet Take 1 tablet (20 mg total) by mouth daily.  30 tablet  3  . etonogestrel (IMPLANON) 68 MG IMPL implant Inject 1 each (68 mg total) into the skin once.  1 each  0  . Prenatal Vit-Fe Fumarate-FA (PRENATAL MULTIVITAMIN) TABS tablet Take 1 tablet by mouth daily at 12 noon.      . valACYclovir (VALTREX) 1000 MG tablet Take 1,000 mg by mouth 2 (two) times daily.       No current facility-administered medications for this visit.    Review of Systems Review of Systems Constitutional: negative for fatigue and weight loss Respiratory: negative for cough and wheezing Cardiovascular: negative for chest pain, fatigue and palpitations Gastrointestinal: negative for abdominal pain and change in bowel habits Genitourinary:negative for abnormal vaginal  discharge Integument/breast: negative for nipple discharge Musculoskeletal:negative for myalgias Neurological: negative for gait problems and tremors Behavioral/Psych: negative for abusive relationship, depression Endocrine: negative for temperature intolerance     Blood pressure 119/73, pulse 79, temperature 97.4 F (36.3 C), height 5' 1.5" (1.562 m), weight 89.812 kg (198 lb), not currently breastfeeding.  Physical Exam Physical Exam General:   alert  Skin suture removed.  No signs of infection    Data Reviewed None  Assessment    Normal exam after Nexplanon insertion    Plan    Follow up as needed.         JACKSON-MOORE,Sharetta Ricchio A 03/04/2014, 10:08 AM

## 2014-03-26 ENCOUNTER — Encounter: Payer: Self-pay | Admitting: Obstetrics & Gynecology

## 2014-04-13 ENCOUNTER — Ambulatory Visit (INDEPENDENT_AMBULATORY_CARE_PROVIDER_SITE_OTHER): Payer: Medicaid Other | Admitting: Obstetrics

## 2014-04-13 DIAGNOSIS — R52 Pain, unspecified: Secondary | ICD-10-CM

## 2014-04-13 NOTE — Progress Notes (Signed)
Patient not seen by physician. 

## 2014-05-21 ENCOUNTER — Encounter: Payer: Self-pay | Admitting: *Deleted

## 2014-05-22 ENCOUNTER — Encounter: Payer: Self-pay | Admitting: Obstetrics & Gynecology

## 2014-09-03 IMAGING — US US PELVIS COMPLETE
1 series · 13 of 25 positions shown · non-contrast
Comparison: None

CLINICAL DATA: Pelvic pain for 2 weeks.  LMP 05/22/2012.

TRANSABDOMINAL AND TRANSVAGINAL ULTRASOUND OF PELVIS
TECHNIQUE: Both transabdominal and transvaginal ultrasound
examinations of the pelvis were performed. Transabdominal technique
was performed for global imaging of the pelvis including uterus,
ovaries, adnexal regions, and pelvic cul-de-sac.

[Series 1: us transvaginal non-ob · 45 acquisitions, 13 frames shown]
[im 1/45]
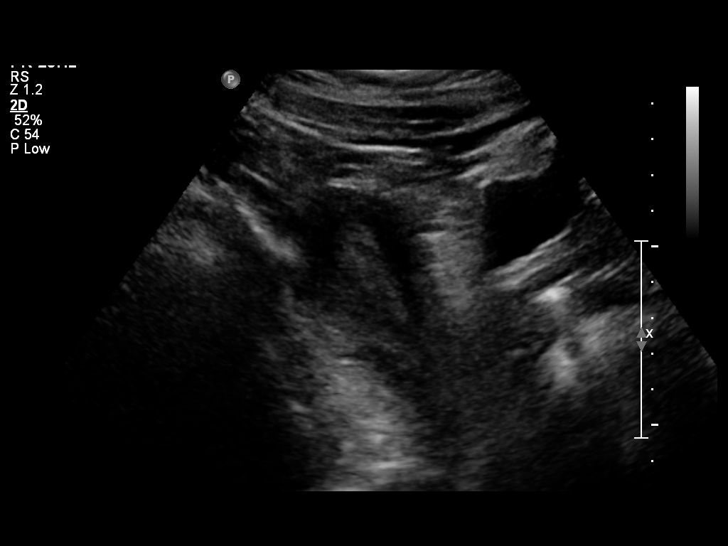
[im 4/45]
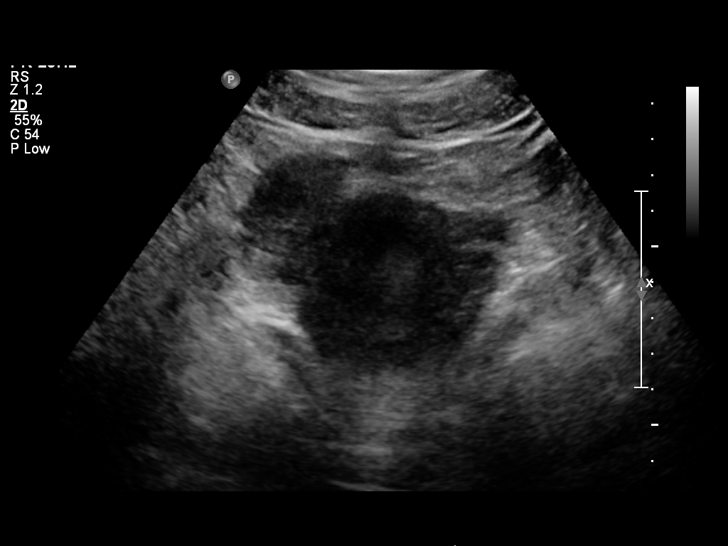
[im 8/45]
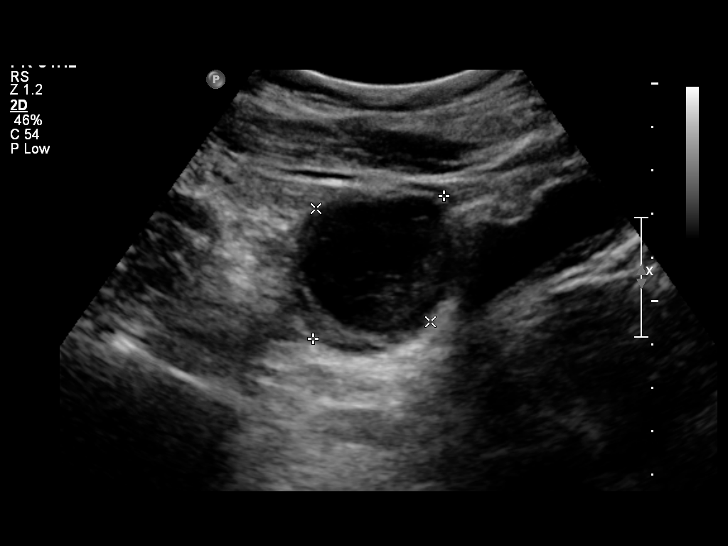
[im 12/45]
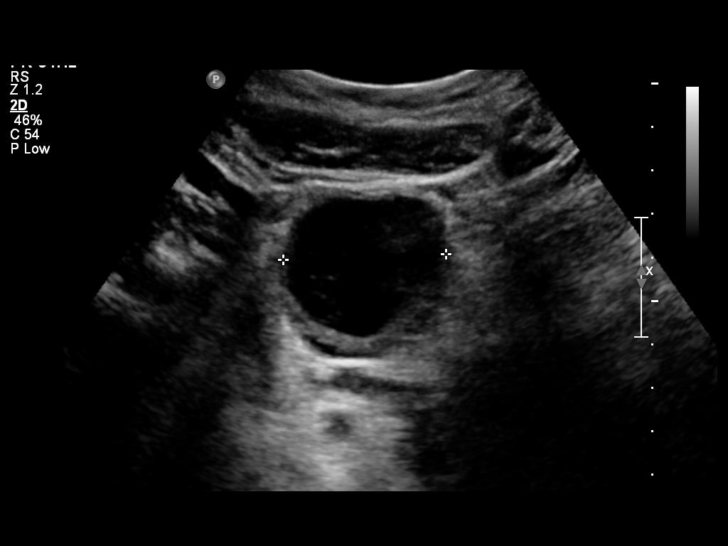
[im 15/45]
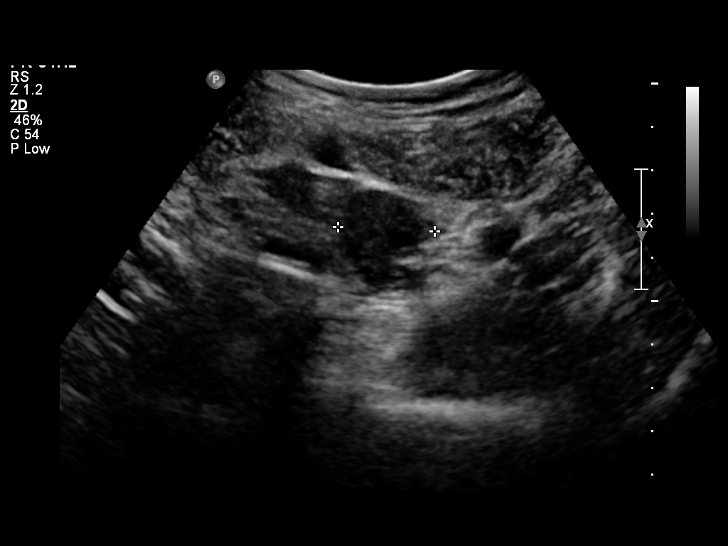
[im 19/45]
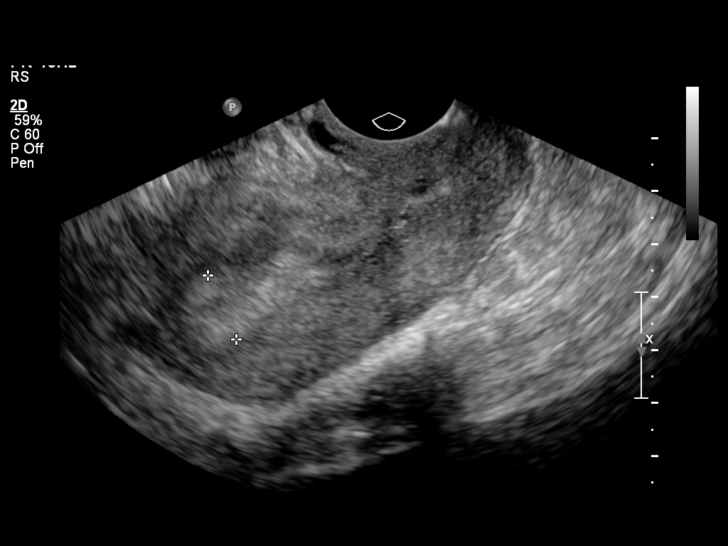
[im 23/45]
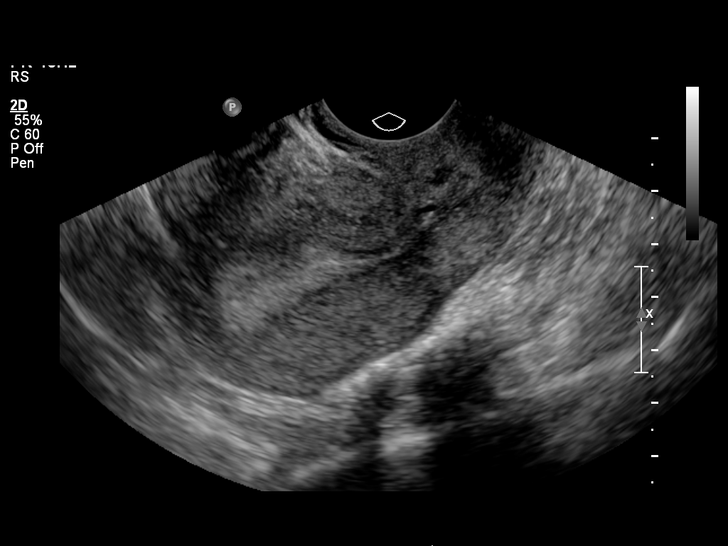
[im 26/45]
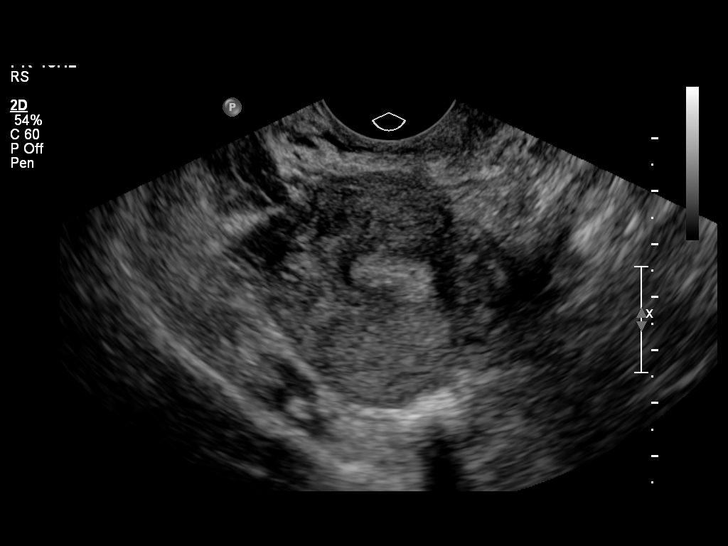
[im 30/45]
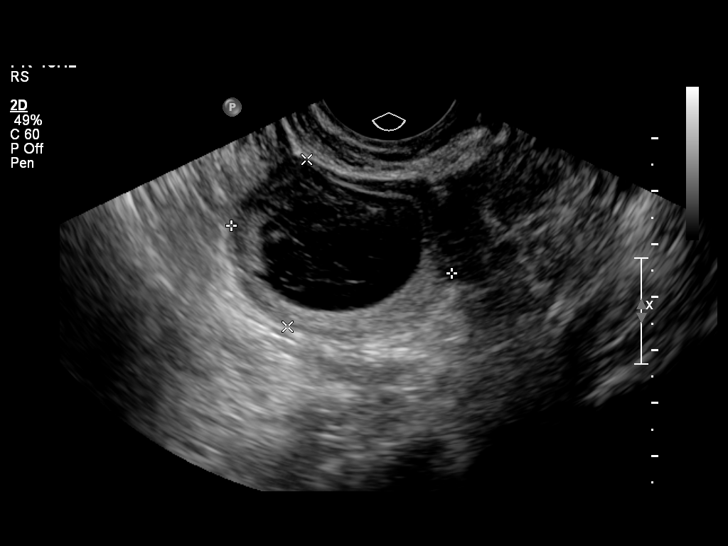
[im 34/45]
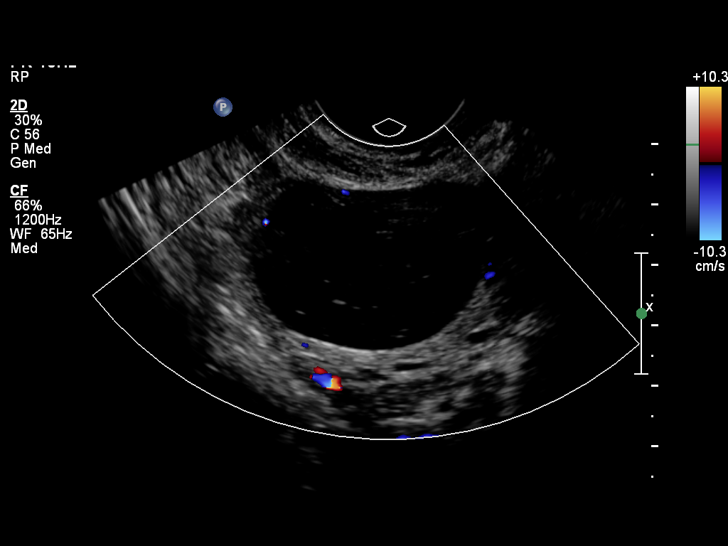
[im 37/45]
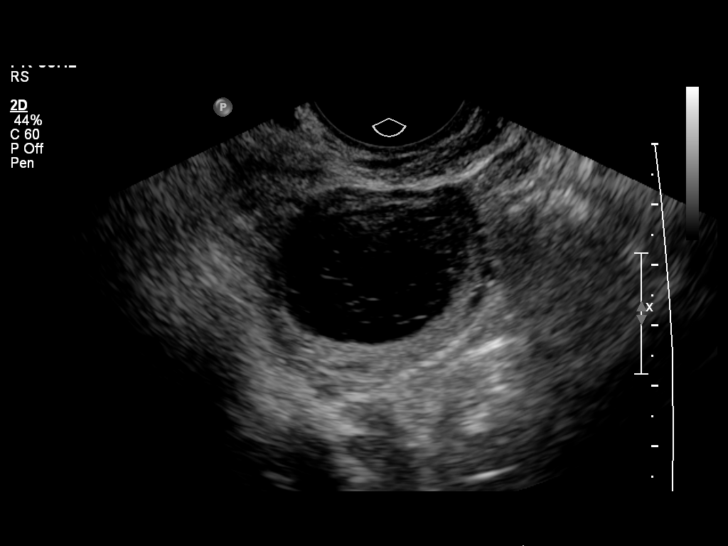
[im 41/45]
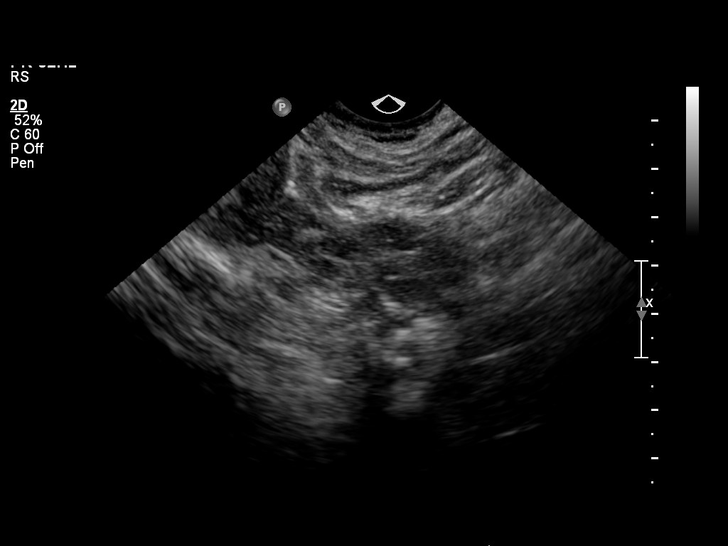
[im 45/45]
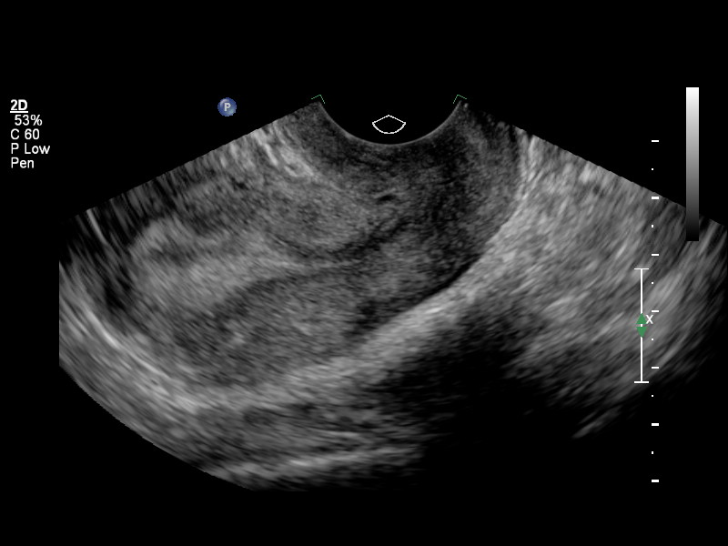

[13 of 25 positions shown; findings below may reference images not displayed]

FINDINGS: Uterus: Uterus is anteverted and measures 8.4 x 3.6 x 4.7 cm.
Myometrium is homogeneous.  No focal uterine mass.

Endometrium: Normal in thickness and appearance.  Measures a
maximum of 13 mm in thickness.

Right ovary:  Measures 4.8 x 3.4 x 3.7 cm and contains a complex
cyst with thin internal lacy septations that measures 3.7 x 2.7 x
3.4 cm.  This is likely a hemorrhagic cyst.  Color Doppler flow is
seen to the surrounding ovarian parenchyma.  No flow is identified
within the complex cyst..  Measures 3.5 x 1.5 x 1.6 cm.

Left ovary: Normal appearance/no adnexal mass

Other findings: Trace amount of free pelvic fluid peri
IMPRESSION: 3.7 cm probable hemorrhagic cyst right ovary. Short-interval follow
up ultrasound in 6-12 weeks is recommended, preferably during the
week following the patient's normal menses.

## 2016-12-10 ENCOUNTER — Ambulatory Visit (INDEPENDENT_AMBULATORY_CARE_PROVIDER_SITE_OTHER): Payer: Medicaid Other | Admitting: Obstetrics & Gynecology

## 2016-12-10 ENCOUNTER — Encounter: Payer: Self-pay | Admitting: Obstetrics & Gynecology

## 2016-12-10 ENCOUNTER — Telehealth: Payer: Self-pay | Admitting: *Deleted

## 2016-12-10 ENCOUNTER — Other Ambulatory Visit (HOSPITAL_COMMUNITY)
Admission: RE | Admit: 2016-12-10 | Discharge: 2016-12-10 | Disposition: A | Discharge status: Home / Self Care | Payer: Medicaid Other | Source: Ambulatory Visit | Attending: Obstetrics & Gynecology | Admitting: Obstetrics & Gynecology

## 2016-12-10 VITALS — BP 117/73 | HR 84 | Ht 62.0 in | Wt 246.0 lb

## 2016-12-10 DIAGNOSIS — R87612 Low grade squamous intraepithelial lesion on cytologic smear of cervix (LGSIL): Secondary | ICD-10-CM | POA: Diagnosis not present

## 2016-12-10 DIAGNOSIS — Z308 Encounter for other contraceptive management: Secondary | ICD-10-CM

## 2016-12-10 DIAGNOSIS — Z113 Encounter for screening for infections with a predominantly sexual mode of transmission: Secondary | ICD-10-CM | POA: Insufficient documentation

## 2016-12-10 DIAGNOSIS — B373 Candidiasis of vulva and vagina: Secondary | ICD-10-CM

## 2016-12-10 DIAGNOSIS — A609 Anogenital herpesviral infection, unspecified: Secondary | ICD-10-CM | POA: Insufficient documentation

## 2016-12-10 DIAGNOSIS — Z01419 Encounter for gynecological examination (general) (routine) without abnormal findings: Secondary | ICD-10-CM | POA: Insufficient documentation

## 2016-12-10 DIAGNOSIS — B3731 Acute candidiasis of vulva and vagina: Secondary | ICD-10-CM

## 2016-12-10 DIAGNOSIS — Z3046 Encounter for surveillance of implantable subdermal contraceptive: Secondary | ICD-10-CM

## 2016-12-10 MED ORDER — VALACYCLOVIR HCL 500 MG PO TABS: 500.0000 mg | ORAL_TABLET | Freq: Every day | ORAL | 12 refills | Status: DC

## 2016-12-10 NOTE — Patient Instructions (Addendum)
Preventive Care 18-39 Years, Female Preventive care refers to lifestyle choices and visits with your health care provider that can promote health and wellness. What does preventive care include?  A yearly physical exam. This is also called an annual well check.  Dental exams once or twice a year.  Routine eye exams. Ask your health care provider how often you should have your eyes checked.  Personal lifestyle choices, including: ? Daily care of your teeth and gums. ? Regular physical activity. ? Eating a healthy diet. ? Avoiding tobacco and drug use. ? Limiting alcohol use. ? Practicing safe sex. ? Taking vitamin and mineral supplements as recommended by your health care provider. What happens during an annual well check? The services and screenings done by your health care provider during your annual well check will depend on your age, overall health, lifestyle risk factors, and family history of disease. Counseling Your health care provider may ask you questions about your:  Alcohol use.  Tobacco use.  Drug use.  Emotional well-being.  Home and relationship well-being.  Sexual activity.  Eating habits.  Work and work Statistician.  Method of birth control.  Menstrual cycle.  Pregnancy history.  Screening You may have the following tests or measurements:  Height, weight, and BMI.  Diabetes screening. This is done by checking your blood sugar (glucose) after you have not eaten for a while (fasting).  Blood pressure.  Lipid and cholesterol levels. These may be checked every 5 years starting at age 33.  Skin check.  Hepatitis C blood test.  Hepatitis B blood test.  Sexually transmitted disease (STD) testing.  BRCA-related cancer screening. This may be done if you have a family history of breast, ovarian, tubal, or peritoneal cancers.  Pelvic exam and Pap test. This may be done every 3 years starting at age 66. Starting at age 58, this may be done every 5  years if you have a Pap test in combination with an HPV test.  Discuss your test results, treatment options, and if necessary, the need for more tests with your health care provider. Vaccines Your health care provider may recommend certain vaccines, such as:  Influenza vaccine. This is recommended every year.  Tetanus, diphtheria, and acellular pertussis (Tdap, Td) vaccine. You may need a Td booster every 10 years.  Varicella vaccine. You may need this if you have not been vaccinated.  HPV vaccine. If you are 74 or younger, you may need three doses over 6 months.  Measles, mumps, and rubella (MMR) vaccine. You may need at least one dose of MMR. You may also need a second dose.  Pneumococcal 13-valent conjugate (PCV13) vaccine. You may need this if you have certain conditions and were not previously vaccinated.  Pneumococcal polysaccharide (PPSV23) vaccine. You may need one or two doses if you smoke cigarettes or if you have certain conditions.  Meningococcal vaccine. One dose is recommended if you are age 73-21 years and a first-year college student living in a residence hall, or if you have one of several medical conditions. You may also need additional booster doses.  Hepatitis A vaccine. You may need this if you have certain conditions or if you travel or work in places where you may be exposed to hepatitis A.  Hepatitis B vaccine. You may need this if you have certain conditions or if you travel or work in places where you may be exposed to hepatitis B.  Haemophilus influenzae type b (Hib) vaccine. You may need this if  you have certain risk factors.  Talk to your health care provider about which screenings and vaccines you need and how often you need them. This information is not intended to replace advice given to you by your health care provider. Make sure you discuss any questions you have with your health care provider. Document Released: 07/07/2001 Document Revised: 01/29/2016  Document Reviewed: 03/12/2015 Elsevier Interactive Patient Education  2017 [employer]    Vaginitis Vaginitis is a condition in which the vaginal tissue swells and becomes red (inflamed). This condition is most often caused by a change in the normal balance of bacteria and yeast that live in the vagina. This change causes an overgrowth of certain bacteria or yeast, which causes the inflammation. There are different types of vaginitis, but the most common types are:  Bacterial vaginosis.  Yeast infection (candidiasis).  Trichomoniasis vaginitis. This is a sexually transmitted disease (STD).  Viral vaginitis.  Atrophic vaginitis.  Allergic vaginitis.  What are the causes? The cause of this condition depends on the type of vaginitis. It can be caused by:  Bacteria (bacterial vaginosis).  Yeast, which is a fungus (yeast infection).  A parasite (trichomoniasis vaginitis).  A virus (viral vaginitis).  Low hormone levels (atrophic vaginitis). Low hormone levels can occur during pregnancy, breastfeeding, or after menopause.  Irritants, such as bubble baths, scented tampons, and feminine sprays (allergic vaginitis).  Other factors can change the normal balance of the yeast and bacteria that live in the vagina. These include:  Antibiotic medicines.  Poor hygiene.  Diaphragms, vaginal sponges, spermicides, birth control pills, and intrauterine devices (IUD).  Sex.  Infection.  Uncontrolled diabetes.  A weakened defense (immune) system.  What increases the risk? This condition is more likely to develop in women who:  Smoke.  Use vaginal douches, scented tampons, or scented sanitary pads.  Wear tight-fitting pants.  Wear thong underwear.  Use oral birth control pills or an IUD.  Have sex without a condom.  Have multiple sex partners.  Have an STD.  Frequently use the spermicide nonoxynol-9.  Eat lots of foods high in sugar.  Have uncontrolled  diabetes.  Have low estrogen levels.  Have a weakened immune system from an immune disorder or medical treatment.  Are pregnant or breastfeeding.  What are the signs or symptoms? Symptoms vary depending on the cause of the vaginitis. Common symptoms include:  Abnormal vaginal discharge. ? The discharge is white, gray, or yellow with bacterial vaginosis. ? The discharge is thick, white, and cheesy with a yeast infection. ? The discharge is frothy and yellow or greenish with trichomoniasis.  A bad vaginal smell. The smell is fishy with bacterial vaginosis.  Vaginal itching, pain, or swelling.  Sex that is painful.  Pain or burning when urinating.  Sometimes there are no symptoms. How is this diagnosed? This condition is diagnosed based on your symptoms and medical history. A physical exam, including a pelvic exam, will also be done. You may also have other tests, including:  Tests to determine the pH level (acidity or alkalinity) of your vagina.  A whiff test, to assess the odor that results when a sample of your vaginal discharge is mixed with a potassium hydroxide solution.  Tests of vaginal fluid. A sample will be examined under a microscope.  How is this treated? Treatment varies depending on the type of vaginitis you have. Your treatment may include:  Antibiotic creams or pills to treat bacterial vaginosis and trichomoniasis.  Antifungal medicines, such as vaginal  creams or suppositories, to treat a yeast infection.  Medicine to ease discomfort if you have viral vaginitis. Your sexual partner should also be treated.  Estrogen delivered in a cream, pill, suppository, or vaginal ring to treat atrophic vaginitis. If vaginal dryness occurs, lubricants and moisturizing creams may help. You may need to avoid scented soaps, sprays, or douches.  Stopping use of a product that is causing allergic vaginitis. Then using a vaginal cream to treat the symptoms.  Follow these  instructions at home: Lifestyle  Keep your genital area clean and dry. Avoid soap, and only rinse the area with water.  Do not douche or use tampons until your health care provider says it is okay to do so. Use sanitary pads, if needed.  Do not have sex until your health care provider approves. When you can return to sex, practice safe sex and use condoms.  Wipe from front to back. This avoids the spread of bacteria from the rectum to the vagina. General instructions  Take over-the-counter and prescription medicines only as told by your health care provider.  If you were prescribed an antibiotic medicine, take or use it as told by your health care provider. Do not stop taking or using the antibiotic even if you start to feel better.  Keep all follow-up visits as told by your health care provider. This is important. How is this prevented?  Use mild, non-scented products. Do not use things that can irritate the vagina, such as fabric softeners. Avoid the following products if they are scented: ? Feminine sprays. ? Detergents. ? Tampons. ? Feminine hygiene products. ? Soaps or bubble baths.  Let air reach your genital area. ? Wear cotton underwear to reduce moisture buildup. ? Avoid wearing underwear while you sleep. ? Avoid wearing tight pants and underwear or nylons without a cotton panel. ? Avoid wearing thong underwear.  Take off any wet clothing, such as bathing suits, as soon as possible.  Practice safe sex and use condoms. Contact a health care provider if:  You have abdominal pain.  You have a fever.  You have symptoms that last for more than 2-3 days. Get help right away if:  You have a fever and your symptoms suddenly get worse. Summary  Vaginitis is a condition in which the vaginal tissue becomes inflamed.This condition is most often caused by a change in the normal balance of bacteria and yeast that live in the vagina.  Treatment varies depending on the type  of vaginitis you have.  Do not douche, use tampons , or have sex until your health care provider approves. When you can return to sex, practice safe sex and use condoms. This information is not intended to replace advice given to you by your health care provider. Make sure you discuss any questions you have with your health care provider. Document Released: 03/08/2007 Document Revised: 06/16/2016 Document Reviewed: 06/16/2016 Elsevier Interactive Patient Education  [employer].

## 2016-12-10 NOTE — Telephone Encounter (Signed)
Patient was in earlier and she thought she had asked for something for a yeast infection. I told her I would forward her request to her provider.

## 2016-12-10 NOTE — Progress Notes (Signed)
Pt presents for annual, pap with gc/ct, and needs rf on Valtrex. Pt also c/o recurrent yeast infections. Small itchy bumps outer vaginal area x 1 month. Nexplanon expires 02/14/14. Pt also c/o low back pain when moving from sitting to standing position.

## 2016-12-10 NOTE — Telephone Encounter (Signed)
No, I said we will await results of testing prior to prescription.

## 2016-12-10 NOTE — Progress Notes (Signed)
GYNECOLOGY ANNUAL PREVENTATIVE CARE ENCOUNTER NOTE  Subjective:   Krista Meza is a 26 y.o. [id number] female here for a routine annual gynecologic exam.   She desires STI screen and Nexplanon surveillance; wants to know when she can get it removed and reinserted.  Current complaints: recurrent candidal vaginitis and bumps on vulva x 1 month.   Denies abnormal vaginal bleeding, discharge, pelvic pain, problems with intercourse or other gynecologic concerns.    Gynecologic History Patient's last menstrual period was 11/11/2016. Contraception: Nexplanon placed 02/14/2014. Last Pap: 2-3 years ago. Results were: normal  Obstetric History OB History  Gravida Para Term Preterm AB Living  2 2 2     2   SAB TAB Ectopic Multiple Live Births          2    # Outcome Date GA Lbr Len/2nd Weight Sex Delivery Anes PTL Lv  2 Term 12/23/13 529w6d 04:08 / 00:28 6 lb 12.3 oz (3.07 kg) F VBAC EPI  LIV  1 Term 04/16/11 538w0d 15:35 / 05:35 6 lb 14.1 oz (3.12 kg) F CS-LVertical EPI  LIV      Past Medical History:  Diagnosis Date  . Anxiety   . Asthma    as child, inhaler for a year  . Condyloma acuminata 10/14/2013   Vaginal white plaque approx 2.5 cm at base of introitus, smooth irregular borders. No pruritis. Biopsy postpartum.   Marland Kitchen. Heart murmur    palpitations  . Heart palpitations   . HSV (herpes simplex virus) anogenital infection   . Urinary tract infection     Past Surgical History:  Procedure Laterality Date  . arm surgery Right   . CESAREAN SECTION  04/16/2011   Procedure: CESAREAN SECTION;  Surgeon: Roseanna RainbowLisa A Jackson-Moore, MD;  Location: [facility];  Service: Gynecology;  Laterality: N/A;    Current Outpatient Prescriptions on File Prior to Visit  Medication Sig Dispense Refill  . etonogestrel (IMPLANON) 68 MG IMPL implant Inject 1 each (68 mg total) into the skin once. 1 each 0  . citalopram (CELEXA) 20 MG tablet Take 1 tablet (20 mg total) by mouth daily. (Patient not taking:  Reported on 12/10/2016) 30 tablet 3  . Prenatal Vit-Fe Fumarate-FA (PRENATAL MULTIVITAMIN) TABS tablet Take 1 tablet by mouth daily at 12 noon.     No current facility-administered medications on file prior to visit.     Allergies  Allergen Reactions  . Amoxicillin Hives  . Penicillins Hives    Social History   Social History  . Marital status: Legally Separated    Spouse name: N/A  . Number of children: N/A  . Years of education: N/A   Occupational History  . Not on file.   Social History Main Topics  . Smoking status: Former Smoker    Types: Cigars    Quit date: 04/08/2013  . Smokeless tobacco: Never Used  . Alcohol use No  . Drug use: No  . Sexual activity: Yes    Birth control/ protection: Implant   Other Topics Concern  . Not on file   Social History Narrative  . No narrative on file    Family History  Problem Relation Age of Onset  . Anesthesia problems Neg Hx   . Hypotension Neg Hx   . Malignant hyperthermia Neg Hx   . Pseudochol deficiency Neg Hx     The following portions of the patient's history were reviewed and updated as appropriate: allergies, current medications, past family history, past medical  history, past social history, past surgical history and problem list.  Review of Systems Pertinent items noted in HPI and remainder of comprehensive ROS otherwise negative.   Objective:  BP 117/73   Pulse 84   Ht 5\' 2"  (1.575 m)   Wt 246 lb (111.6 kg)   LMP 11/11/2016   BMI 44.99 kg/m  CONSTITUTIONAL: Well-developed, well-nourished female in no acute distress.  HENT:  Normocephalic, atraumatic, External right and left ear normal. Oropharynx is clear and moist EYES: Conjunctivae and EOM are normal. Pupils are equal, round, and reactive to light. No scleral icterus.  NECK: Normal range of motion, supple, no masses.  Normal thyroid.  SKIN: Skin is warm and dry. No rash noted. Not diaphoretic. No erythema. No pallor. NEUROLOGIC: Alert and oriented  to person, place, and time. Normal reflexes, muscle tone coordination. No cranial nerve deficit noted. PSYCHIATRIC: Normal mood and affect. Normal behavior. Normal judgment and thought content. CARDIOVASCULAR: Normal heart rate noted, regular rhythm RESPIRATORY: Clear to auscultation bilaterally. Effort and breath sounds normal, no problems with respiration noted. BREASTS: Symmetric in size. No masses, skin changes, nipple drainage, or lymphadenopathy. ABDOMEN: Soft, normal bowel sounds, no distention noted.  No tenderness, rebound or guarding.  PELVIC: Normal appearing external genitalia with healed folliculitis lesions; normal appearing vaginal mucosa and cervix.  No abnormal discharge noted.  Pap smear obtained.  Normal uterine size, no other palpable masses, no uterine or adnexal tenderness. MUSCULOSKELETAL: Normal range of motion. No tenderness.  No cyanosis, clubbing, or edema.  2+ distal pulses.   Assessment and Plan:  1. Encounter for gynecological examination with Papanicolaou smear of cervix Will follow up results of pap smear and manage accordingly. - Cytology - PAP  2. Screen for STD (sexually transmitted disease) - Cytology - PAP - HIV antibody - Hepatitis C antibody - Hepatitis B surface antigen - RPR Will follow up results and manage accordingly.  3. HSV (herpes simplex virus) anogenital infection Refill done as per her request. - valACYclovir (VALTREX) 500 MG tablet; Take 1 tablet (500 mg total) by mouth daily. Can increase to twice a day for 5 days in the event of a recurrence  Dispense: 30 tablet; Refill: 12  4. Recurrent candidiasis of vagina Will follow up testing today. Reassured about healed vulvar lesions, discouraged shaving.  Proper vulvar hygiene emphasized: discussed avoidance of perfumed soaps, detergents, lotions and any type of douches; in addition to wearing cotton underwear and no underwear at night.  Also recommended cleaning front to back, voiding and  cleaning up after intercourse.  Encouraged use of probiotics, RepHresh to reestablish proper vaginal pH.  If needed, may consider prolonged boric acid therapy.  5. Encounter for surveillance of Nexplanon subdermal contraceptive No problems reported with Nexplanon, due for removal and reinsertion after 02/15/2017; appointment will be scheduled.  Routine preventative health maintenance measures emphasized. Please refer to After Visit Summary for other counseling recommendations.   Return in about 2 months (around 02/15/2017) for Nexplanon removal and reinsertion.   Jaynie Collins, MD, FACOG Attending Obstetrician & Gynecologist, [facility] for [facility]

## 2016-12-11 LAB — RPR: RPR Ser Ql: NONREACTIVE

## 2016-12-11 LAB — HEPATITIS C ANTIBODY: Hep C Virus Ab: <0.1 s/co ratio (ref 0.0–0.9)

## 2016-12-11 LAB — HEPATITIS B SURFACE ANTIGEN: Hepatitis B Surface Ag: NEGATIVE

## 2016-12-11 LAB — HIV ANTIBODY (ROUTINE TESTING W REFLEX): HIV Screen 4th Generation wRfx: NONREACTIVE

## 2016-12-11 NOTE — Telephone Encounter (Signed)
Patient notified

## 2016-12-13 LAB — CYTOLOGY - PAP
BACTERIAL VAGINITIS: NEGATIVE
CHLAMYDIA, DNA PROBE: NEGATIVE
Candida vaginitis: NEGATIVE
NEISSERIA GONORRHEA: NEGATIVE
TRICH (WINDOWPATH): NEGATIVE

## 2016-12-15 ENCOUNTER — Encounter: Payer: Self-pay | Admitting: Obstetrics & Gynecology

## 2016-12-15 ENCOUNTER — Telehealth: Payer: Self-pay

## 2016-12-15 NOTE — Telephone Encounter (Signed)
Contacted Krista Meza and advised of results and need for colpo, Krista Meza has family planning medicaid, provided Krista Meza with # to [facility] to set up arrangements for procedure. Krista Meza has requested that Dr. Macon LargeAnyanwu call her to discuss lab results, routed to provider.

## 2016-12-15 NOTE — Progress Notes (Signed)
She can come in to discuss results and  colposcopy recommendations face-to-face with provider. This will be better than a phone call. She can scheduled to see any provider to discuss results.  Jaynie CollinsUgonna Justyce Baby, MD

## 2017-01-06 ENCOUNTER — Other Ambulatory Visit (HOSPITAL_COMMUNITY)
Admission: RE | Admit: 2017-01-06 | Discharge: 2017-01-06 | Disposition: A | Discharge status: Home / Self Care | Payer: Medicaid Other | Source: Ambulatory Visit | Attending: Obstetrics & Gynecology | Admitting: Obstetrics & Gynecology

## 2017-01-06 ENCOUNTER — Ambulatory Visit (INDEPENDENT_AMBULATORY_CARE_PROVIDER_SITE_OTHER): Payer: Medicaid Other | Admitting: Obstetrics & Gynecology

## 2017-01-06 ENCOUNTER — Encounter: Payer: Self-pay | Admitting: Obstetrics & Gynecology

## 2017-01-06 DIAGNOSIS — Z3202 Encounter for pregnancy test, result negative: Secondary | ICD-10-CM | POA: Diagnosis not present

## 2017-01-06 DIAGNOSIS — R87612 Low grade squamous intraepithelial lesion on cytologic smear of cervix (LGSIL): Secondary | ICD-10-CM

## 2017-01-06 DIAGNOSIS — N871 Moderate cervical dysplasia: Secondary | ICD-10-CM | POA: Insufficient documentation

## 2017-01-06 LAB — POCT URINE PREGNANCY: Preg Test, Ur: NEGATIVE

## 2017-01-06 NOTE — Progress Notes (Signed)
Patient is here for colpo for LGSIL PAP.  UPT is NEGATIVE.  Informed Consent obtained.

## 2017-01-06 NOTE — Progress Notes (Signed)
Patient ID: Krista Meza, female   DOB: 21-Mar-1991, 26 y.o.   MRN: [id number]  Chief Complaint  Patient presents with  . Colposcopy    HPI Krista RathkeKhalilah V Linck is a 26 y.o. female.  [id number] Patient's last menstrual period was 10/29/2016 (approximate).  HPI  Indications: Pap smear on July 2018 showed: low-grade squamous intraepithelial neoplasia (LGSIL - encompassing HPV,mild dysplasia,CIN I). Previous colposcopy: none. Prior cervical treatment: no treatment.  Past Medical History:  Diagnosis Date  . Anxiety   . Asthma    as child, inhaler for a year  . Condyloma acuminata 10/14/2013   Vaginal white plaque approx 2.5 cm at base of introitus, smooth irregular borders. No pruritis. Biopsy postpartum.   Marland Kitchen. Heart murmur    palpitations  . Heart palpitations   . HSV (herpes simplex virus) anogenital infection   . Urinary tract infection     Past Surgical History:  Procedure Laterality Date  . arm surgery Right   . CESAREAN SECTION  04/16/2011   Procedure: CESAREAN SECTION;  Surgeon: Roseanna RainbowLisa A Jackson-Moore, MD;  Location: [facility];  Service: Gynecology;  Laterality: N/A;    Family History  Problem Relation Age of Onset  . Anesthesia problems Neg Hx   . Hypotension Neg Hx   . Malignant hyperthermia Neg Hx   . Pseudochol deficiency Neg Hx     Social History Social History  Substance Use Topics  . Smoking status: Former Smoker    Types: Cigars    Quit date: 04/08/2013  . Smokeless tobacco: Never Used  . Alcohol use No    Allergies  Allergen Reactions  . Amoxicillin Hives  . Penicillins Hives    Current Outpatient Prescriptions  Medication Sig Dispense Refill  . etonogestrel (IMPLANON) 68 MG IMPL implant Inject 1 each (68 mg total) into the skin once. 1 each 0  . citalopram (CELEXA) 20 MG tablet Take 1 tablet (20 mg total) by mouth daily. (Patient not taking: Reported on 12/10/2016) 30 tablet 3  . Prenatal Vit-Fe Fumarate-FA (PRENATAL MULTIVITAMIN) TABS tablet Take 1  tablet by mouth daily at 12 noon.    . valACYclovir (VALTREX) 500 MG tablet Take 1 tablet (500 mg total) by mouth daily. Can increase to twice a day for 5 days in the event of a recurrence (Patient not taking: Reported on 01/06/2017) 30 tablet 12   No current facility-administered medications for this visit.     Review of Systems Review of Systems  Blood pressure 114/76, pulse 91, weight 215 lb 8 oz (97.8 kg), last menstrual period 10/29/2016.  Physical Exam Physical Exam  Data Reviewed  Pap result Assessment    Procedure Details  The risks and benefits of the procedure and Written informed consent obtained.  Speculum placed in vagina and excellent visualization of cervix achieved, cervix swabbed x 3 with acetic acid solution. Ectrpion, mild awe ant and posterior, SCJand TZ seen no endocervical lesion Specimens: ECC and Bx at 12 and 6  Complications: none.     Plan    Specimens labelled and sent to Pathology. Call to discuss Pathology results in 2 weeks.      Scheryl DarterJames Macklyn Glandon 01/06/2017, 3:47 PM

## 2017-01-06 NOTE — Patient Instructions (Signed)

## 2017-01-07 ENCOUNTER — Encounter: Payer: Self-pay | Admitting: Obstetrics & Gynecology

## 2017-01-26 ENCOUNTER — Telehealth: Payer: Self-pay

## 2017-01-26 NOTE — Telephone Encounter (Signed)
Pt informed of colpo results. Per Dr. Debroah LoopArnold pt needs to be scheduled for a LEEP procedure.

## 2017-01-27 ENCOUNTER — Encounter: Payer: Self-pay | Admitting: *Deleted

## 2017-02-09 ENCOUNTER — Encounter: Payer: Self-pay | Admitting: Obstetrics & Gynecology

## 2017-02-12 ENCOUNTER — Inpatient Hospital Stay (HOSPITAL_COMMUNITY)
Admission: AD | Admit: 2017-02-12 | Discharge: 2017-02-12 | Disposition: A | Discharge status: Home / Self Care | Payer: Medicaid Other | Source: Ambulatory Visit | Attending: Obstetrics & Gynecology | Admitting: Obstetrics & Gynecology

## 2017-02-12 DIAGNOSIS — Z880 Allergy status to penicillin: Secondary | ICD-10-CM | POA: Diagnosis not present

## 2017-02-12 DIAGNOSIS — B373 Candidiasis of vulva and vagina: Secondary | ICD-10-CM | POA: Diagnosis not present

## 2017-02-12 DIAGNOSIS — Z87891 Personal history of nicotine dependence: Secondary | ICD-10-CM | POA: Insufficient documentation

## 2017-02-12 DIAGNOSIS — Z3202 Encounter for pregnancy test, result negative: Secondary | ICD-10-CM | POA: Diagnosis not present

## 2017-02-12 DIAGNOSIS — L293 Anogenital pruritus, unspecified: Secondary | ICD-10-CM | POA: Diagnosis present

## 2017-02-12 DIAGNOSIS — B3731 Acute candidiasis of vulva and vagina: Secondary | ICD-10-CM

## 2017-02-12 LAB — URINALYSIS, ROUTINE W REFLEX MICROSCOPIC
Bilirubin Urine: NEGATIVE
Glucose, UA: NEGATIVE mg/dL
Hgb urine dipstick: NEGATIVE
Ketones, ur: NEGATIVE mg/dL
Nitrite: NEGATIVE
PROTEIN: NEGATIVE mg/dL
SPECIFIC GRAVITY, URINE: 1.016 (ref 1.005–1.030)
pH: 6 (ref 5.0–8.0)

## 2017-02-12 LAB — WET PREP, GENITAL
CLUE CELLS WET PREP: NONE SEEN
SPERM: NONE SEEN
TRICH WET PREP: NONE SEEN
Yeast Wet Prep HPF POC: NONE SEEN

## 2017-02-12 LAB — POCT PREGNANCY, URINE: PREG TEST UR: NEGATIVE

## 2017-02-12 MED ORDER — FLUCONAZOLE 150 MG PO TABS: 150.0000 mg | ORAL_TABLET | Freq: Once | ORAL | 0 refills | Status: AC

## 2017-02-12 NOTE — Discharge Instructions (Signed)

## 2017-02-12 NOTE — MAU Note (Signed)
Vaginal itching and burning for past 1.5 weeks. Has tried Vaseline and hydrocortisone cream with no relief.  Denies vaginal discharge or bleeding.

## 2017-02-12 NOTE — MAU Provider Note (Signed)
History     CSN: [id number]  Arrival date and time: 02/12/17 4098   First Provider Initiated Contact with Patient 02/12/17 [phone]      Chief Complaint  Patient presents with  . Vaginal Itching   26 y.o non-pregnant female here with vaginal itching, irritation, and discharge. Sx started over 1 month ago. Discharge is white and "looks like cottage cheese". No new partner in 8 years. No new detergents or skin products. She has tried AZO yeast, hydrocortisone, and vaseline and sx improve temporarily then come back.    Past Medical History:  Diagnosis Date  . Anxiety   . Asthma    as child, inhaler for a year  . Condyloma acuminata 10/14/2013   Vaginal white plaque approx 2.5 cm at base of introitus, smooth irregular borders. No pruritis. Biopsy postpartum.   Marland Kitchen Heart murmur    palpitations  . Heart palpitations   . HSV (herpes simplex virus) anogenital infection   . Urinary tract infection     Past Surgical History:  Procedure Laterality Date  . arm surgery Right   . CESAREAN SECTION  04/16/2011   Procedure: CESAREAN SECTION;  Surgeon: Roseanna Rainbow, MD;  Location: [facility];  Service: Gynecology;  Laterality: N/A;    Family History  Problem Relation Age of Onset  . Anesthesia problems Neg Hx   . Hypotension Neg Hx   . Malignant hyperthermia Neg Hx   . Pseudochol deficiency Neg Hx     Social History  Substance Use Topics  . Smoking status: Former Smoker    Types: Cigars    Quit date: 04/08/2013  . Smokeless tobacco: Never Used  . Alcohol use No    Allergies:  Allergies  Allergen Reactions  . Amoxicillin Hives    Has patient had a PCN reaction causing immediate rash, facial/tongue/throat swelling, SOB or lightheadedness with hypotension: Yes Has patient had a PCN reaction causing severe rash involving mucus membranes or skin necrosis: No Has patient had a PCN reaction that required hospitalization: No Has patient had a PCN reaction occurring within the last 10  years: No If all of the above answers are "NO", then may proceed with Cephalosporin use.   Marland Kitchen Penicillins Hives    Has patient had a PCN reaction causing immediate rash, facial/tongue/throat swelling, SOB or lightheadedness with hypotension: Yes Has patient had a PCN reaction causing severe rash involving mucus membranes or skin necrosis: No Has patient had a PCN reaction that required hospitalization: No Has patient had a PCN reaction occurring within the last 10 years: No If all of the above answers are "NO", then may proceed with Cephalosporin use.     Prescriptions Prior to Admission  Medication Sig Dispense Refill Last Dose  . etonogestrel (IMPLANON) 68 MG IMPL implant Inject 1 each (68 mg total) into the skin once. 1 each 0 still implanted  . valACYclovir (VALTREX) 500 MG tablet Take 1 tablet (500 mg total) by mouth daily. Can increase to twice a day for 5 days in the event of a recurrence (Patient not taking: Reported on 01/06/2017) 30 tablet 12 Not Taking    Review of Systems  Constitutional: Negative.   Genitourinary: Positive for vaginal discharge.   Physical Exam   Blood pressure (!) 141/74, pulse 83, temperature 98.2 F (36.8 C), temperature source Oral, resp. rate 17, weight 217 lb (98.4 kg), last menstrual period 01/08/2017, SpO2 99 %.  Physical Exam  Nursing note and vitals reviewed. Constitutional: She is oriented to person,  place, and time. She appears well-developed and well-nourished. No distress.  HENT:  Head: Normocephalic.  Neck: Normal range of motion.  Cardiovascular: Normal rate.   Respiratory: Effort normal.  Genitourinary: Vagina normal. There is no rash, tenderness, lesion or injury on the right labia. There is no rash, tenderness, lesion or injury on the left labia. No vaginal discharge found.  Musculoskeletal: Normal range of motion.  Neurological: She is alert and oriented to person, place, and time.  Skin: Skin is warm and dry.  Psychiatric: She  has a normal mood and affect.   Results for orders placed or performed during the hospital encounter of 02/12/17 (from the past 24 hour(s))  Urinalysis, Routine w reflex microscopic     Status: Abnormal   Collection Time: 02/12/17  8:27 AM  Result Value Ref Range   Color, Urine YELLOW YELLOW   APPearance HAZY (A) CLEAR   Specific Gravity, Urine 1.016 1.005 - 1.030   pH 6.0 5.0 - 8.0   Glucose, UA NEGATIVE NEGATIVE mg/dL   Hgb urine dipstick NEGATIVE NEGATIVE   Bilirubin Urine NEGATIVE NEGATIVE   Ketones, ur NEGATIVE NEGATIVE mg/dL   Protein, ur NEGATIVE NEGATIVE mg/dL   Nitrite NEGATIVE NEGATIVE   Leukocytes, UA LARGE (A) NEGATIVE   RBC / HPF 0-5 0 - 5 RBC/hpf   WBC, UA 6-30 0 - 5 WBC/hpf   Bacteria, UA RARE (A) NONE SEEN   Squamous Epithelial / LPF 6-30 (A) NONE SEEN   Mucus PRESENT   Pregnancy, urine POC     Status: None   Collection Time: 02/12/17  8:40 AM  Result Value Ref Range   Preg Test, Ur NEGATIVE NEGATIVE  Wet prep, genital     Status: Abnormal   Collection Time: 02/12/17  8:49 AM  Result Value Ref Range   Yeast Wet Prep HPF POC NONE SEEN NONE SEEN   Trich, Wet Prep NONE SEEN NONE SEEN   Clue Cells Wet Prep HPF POC NONE SEEN NONE SEEN   WBC, Wet Prep HPF POC MANY (A) NONE SEEN   Sperm NONE SEEN     MAU Course  Procedures  MDM Labs ordered and reviewed. No yeast on wet prep but will treat for yeast vaginitis based on sx. Stable for discharge home.  Assessment and Plan   1. Yeast vaginitis    Discharge home Follow up with OBGYN or PCP as needed Return to MAU for OBGYN emergencies Rx Diflucan  Allergies as of 02/12/2017      Reactions   Amoxicillin Hives   Has patient had a PCN reaction causing immediate rash, facial/tongue/throat swelling, SOB or lightheadedness with hypotension: Yes Has patient had a PCN reaction causing severe rash involving mucus membranes or skin necrosis: No Has patient had a PCN reaction that required hospitalization: No Has  patient had a PCN reaction occurring within the last 10 years: No If all of the above answers are "NO", then may proceed with Cephalosporin use.   Penicillins Hives   Has patient had a PCN reaction causing immediate rash, facial/tongue/throat swelling, SOB or lightheadedness with hypotension: Yes Has patient had a PCN reaction causing severe rash involving mucus membranes or skin necrosis: No Has patient had a PCN reaction that required hospitalization: No Has patient had a PCN reaction occurring within the last 10 years: No If all of the above answers are "NO", then may proceed with Cephalosporin use.      Medication List    TAKE these medications   etonogestrel  68 MG Impl implant Commonly known as:  NEXPLANON Inject 1 each (68 mg total) into the skin once.   fluconazole 150 MG tablet Commonly known as:  DIFLUCAN Take 1 tablet (150 mg total) by mouth once. May repeat dose on day 4   valACYclovir 500 MG tablet Commonly known as:  VALTREX Take 1 tablet (500 mg total) by mouth daily. Can increase to twice a day for 5 days in the event of a recurrence            Discharge Care Instructions        Start     Ordered   02/12/17 0000  Discharge patient    Question Answer Comment  Discharge disposition 01-Home or Self Care   Discharge patient date 02/12/2017      02/12/17 0956   02/12/17 0000  fluconazole (DIFLUCAN) 150 MG tablet   Once    Question:  Supervising Provider  Answer:  Adam Phenix   02/12/17 0956     Donette Larry, CNM 02/12/2017, 10:03 AM

## 2017-02-15 ENCOUNTER — Encounter: Payer: Self-pay | Admitting: Obstetrics and Gynecology

## 2017-02-15 ENCOUNTER — Encounter: Payer: Self-pay | Admitting: *Deleted

## 2017-02-15 ENCOUNTER — Ambulatory Visit (INDEPENDENT_AMBULATORY_CARE_PROVIDER_SITE_OTHER): Payer: Medicaid Other | Admitting: Obstetrics and Gynecology

## 2017-02-15 VITALS — BP 115/78 | HR 81 | Ht 62.0 in | Wt 218.0 lb

## 2017-02-15 DIAGNOSIS — Z3049 Encounter for surveillance of other contraceptives: Secondary | ICD-10-CM | POA: Diagnosis not present

## 2017-02-15 DIAGNOSIS — Z3202 Encounter for pregnancy test, result negative: Secondary | ICD-10-CM | POA: Diagnosis not present

## 2017-02-15 DIAGNOSIS — R87612 Low grade squamous intraepithelial lesion on cytologic smear of cervix (LGSIL): Secondary | ICD-10-CM

## 2017-02-15 DIAGNOSIS — Z3046 Encounter for surveillance of implantable subdermal contraceptive: Secondary | ICD-10-CM

## 2017-02-15 LAB — POCT URINE PREGNANCY: Preg Test, Ur: NEGATIVE

## 2017-02-15 LAB — GC/CHLAMYDIA PROBE AMP (~~LOC~~) NOT AT ARMC
CHLAMYDIA, DNA PROBE: NEGATIVE
NEISSERIA GONORRHEA: NEGATIVE

## 2017-02-15 MED ORDER — ETONOGESTREL 68 MG ~~LOC~~ IMPL
68.0000 mg | DRUG_IMPLANT | Freq: Once | SUBCUTANEOUS | Status: AC
  Administered 2017-02-15: 68 mg via SUBCUTANEOUS

## 2017-02-15 NOTE — Progress Notes (Signed)
Pt presents for Nexplanon removal/reinsertion. Expires this month.   Krista Meza is a 26 y.o. [phone] here for Nexplanon removal andNexplanon insertion.    Discussed abnormal pap smear/colpo Bx with pt. LEEP had been recommended but she canceled, wanting a second opinion. She has not obtained second opinion yet. 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 Information on LEEP provided to pt.    Nexplanon Removal and Insertion  Patient identified, informed consent performed, consent signed.   Patient does understand that irregular bleeding is a very common side effect of this medication. She was advised to have backup contraception for one week after replacement of the implant. Pregnancy test in clinic today was negative.  Appropriate time out taken. Implanon site identified. Area prepped in usual sterile fashon. One ml of 1% lidocaine was used to anesthetize the area at the distal end of the implant. A small stab incision was made right beside the implant on the distal portion. The Nexplanon rod was grasped using hemostats and removed without difficulty. There was minimal blood loss. There were no complications. Area was then injected with 3 ml of 1 % lidocaine. She was re-prepped with betadine, Nexplanon removed from packaging, Device confirmed in needle, then inserted full length of needle and withdrawn per handbook instructions. Nexplanon was able to palpated in the patient's arm; patient palpated the insert herself.  There was minimal blood loss. Patient insertion site covered with guaze and a pressure bandage to reduce any bruising. The patient tolerated the procedure well and was given post procedure instructions.  She was advised to have backup contraception for one week.     Hermina Staggers, MD, FACOG Attending Obstetrician & Gynecologist Center for [facility], [facility]

## 2017-02-15 NOTE — Patient Instructions (Signed)
Cervical Conization °Cervical conization (cone biopsy) is a procedure in which a cone-shaped portion of the cervix is cut out so that it can be examined under a microscope. The procedure is done to check for cancer cells or cells that might turn into cancer (precancerous cells). You may have this procedure if: °· You have abnormal bleeding from your cervix. °· You had an abnormal Pap test. °· Something abnormal was seen on your cervix during an exam. ° °This procedure is performed in either a health care provider’s office or in an operating room. °Tell a health care provider about: °· Any allergies you have. °· All medicines you are taking, including vitamins, herbs, eye drops, creams, and over-the-counter medicines. °· Any problems you or family members have had with the use of anesthetic medicines. °· Any blood disorders you have. °· Any surgeries you have had. °· Any medical conditions you have. °· Your smoking habits. °· When you normally have your period. °· Whether you are pregnant or may be pregnant. °What are the risks? °Generally, this is a safe procedure. However, problems may occur, including: °· Heavy bleeding for several days or weeks after the procedure. °· Allergic reactions to medicines or dyes. °· Increased risk of preterm labor in future pregnancies. °· Infection (rare). °· Damage to the cervix or other structures or organs (rare). ° °What happens before the procedure? °Staying hydrated °Follow instructions from your health care provider about hydration, which may include: °· Up to 2 hours before the procedure - you may continue to drink clear liquids, such as water, clear fruit juice, black coffee, and plain tea. ° °Eating and drinking restrictions °Follow instructions from your health care provider about eating and drinking, which may include: °· 8 hours before the procedure - stop eating heavy meals or foods such as meat, fried foods, or fatty foods. °· 6 hours before the procedure - stop eating  light meals or foods, such as toast or cereal. °· 6 hours before the procedure - stop drinking milk or drinks that contain milk. °· 2 hours before the procedure - stop drinking clear liquids. ° °General instructions °· Do not douche, have sex, use tampons, or use any vaginal medicines before the procedure as told by your health care provider. °· You may be asked to empty your bladder and bowel right before the procedure. °· Ask your health care provider about: °? Changing or stopping your normal medicines. This is important if you take diabetes medicines or blood thinners. °? Taking medicines such as aspirin and ibuprofen. These medicines can thin your blood. Do not take these medicines before your procedure if your doctor tells you not to. °· Plan to have someone take you home from the hospital or clinic. °What happens during the procedure? °· To reduce your risk of infection: °? Your health care team will wash or sanitize their hands. °? Your skin will be washed with soap. °? Hair may be removed from the surgical area. °· You will undress from the waist down and be given a gown to wear. °· You will lie on an examining table and put your feet in stirrups. °· An IV tube will be inserted into one of your veins. °· You will be given one or more of the following: °? A medicine to help you relax (sedative). °? A medicine to numb the area (local anesthetic). °? A medicine to make you fall asleep (general anesthetic). °? A medicine that numbs the cervix (cervical block). °·   A lubricated device called a speculum will be inserted into your vagina. It will be used to spread open the walls of the vagina so your health care provider can see the inside of the vagina and cervix better.  An instrument that has a magnifying lens and a light (colposcope) will let your health care provider examine the cervix more closely.  Your health care provider will apply a solution to your cervix. This turns abnormal areas a pale  color.  A tissue sample will be removed from the cervix using one of the following methods: ? The cold knife method. In this method, the tissue is cut out with a knife (scalpel). ? The loop electrosurgical excision procedure (LEEP) method. In this method, the tissue is cut out with a thin wire that can burn (cauterize) the tissue with an electrical current. ? Laser treatment method. In this method, the tissue is cut out and then cauterized with a laser beam to prevent bleeding.  Your health care provider will apply a paste over the biopsy areas to help control bleeding.  The tissue sample will be examined under a microscope. The procedure may vary among health care providers and hospitals. What happens after the procedure?  Your blood pressure, heart rate, breathing rate, and blood oxygen level will be monitored often until the medicines you were given have worn off.  If you were given a local anesthetic, you will rest at the clinic or hospital until you are stable and feel ready to go home.  If you were given a general anesthetic, you may be monitored for a longer period of time.  You may have some cramping.  You may have bloody discharge or light to moderate bleeding.  You may have dark discharge coming from your vagina. This is from the paste used on the cervix to prevent bleeding. Summary  Cervical conization is a procedure in which a cone-shaped portion of the cervix is cut out so that it can be examined under a microscope.  The procedure is done to check for cancer cells or cells that might turn into cancer (precancerous cells). This information is not intended to replace advice given to you by your health care provider. Make sure you discuss any questions you have with your health care provider. Document Released: 02/18/2005 Document Revised: 05/13/2016 Document Reviewed: 05/13/2016 Elsevier Interactive Patient Education  2017 [employer] Nexplanon Instructions After  Insertion   Keep bandage clean and dry for 24 hours   May use ice/Tylenol/Ibuprofen for soreness or pain   If you develop fever, drainage or increased warmth from incision site-contact office immediately

## 2017-03-15 ENCOUNTER — Ambulatory Visit: Payer: Medicaid Other | Admitting: Obstetrics and Gynecology

## 2017-03-18 ENCOUNTER — Encounter (HOSPITAL_COMMUNITY): Payer: Self-pay | Admitting: Emergency Medicine

## 2017-03-18 DIAGNOSIS — Z79899 Other long term (current) drug therapy: Secondary | ICD-10-CM | POA: Insufficient documentation

## 2017-03-18 DIAGNOSIS — R51 Headache: Secondary | ICD-10-CM | POA: Insufficient documentation

## 2017-03-18 DIAGNOSIS — J45909 Unspecified asthma, uncomplicated: Secondary | ICD-10-CM | POA: Insufficient documentation

## 2017-03-18 DIAGNOSIS — Z87891 Personal history of nicotine dependence: Secondary | ICD-10-CM | POA: Diagnosis not present

## 2017-03-18 DIAGNOSIS — R590 Localized enlarged lymph nodes: Secondary | ICD-10-CM | POA: Diagnosis not present

## 2017-03-18 NOTE — ED Triage Notes (Signed)
Pt c/o right sided swelling to the face, dental pain when eating, and a "lump" in her throat x 2 days. Denies shortness of breath.

## 2017-03-19 ENCOUNTER — Emergency Department (HOSPITAL_COMMUNITY)
Admission: EM | Admit: 2017-03-19 | Discharge: 2017-03-19 | Disposition: A | Discharge status: Home / Self Care | Payer: Medicaid Other | Attending: Emergency Medicine | Admitting: Emergency Medicine

## 2017-03-19 DIAGNOSIS — R51 Headache: Secondary | ICD-10-CM

## 2017-03-19 DIAGNOSIS — R590 Localized enlarged lymph nodes: Secondary | ICD-10-CM

## 2017-03-19 DIAGNOSIS — R519 Headache, unspecified: Secondary | ICD-10-CM

## 2017-03-19 MED ORDER — CLINDAMYCIN HCL 150 MG PO CAPS
300.0000 mg | ORAL_CAPSULE | Freq: Once | ORAL | Status: AC
  Administered 2017-03-19: 300 mg via ORAL
  Filled 2017-03-19: qty 2

## 2017-03-19 MED ORDER — IBUPROFEN 400 MG PO TABS
800.0000 mg | ORAL_TABLET | Freq: Once | ORAL | Status: AC
  Administered 2017-03-19: 800 mg via ORAL
  Filled 2017-03-19: qty 2

## 2017-03-19 MED ORDER — CLINDAMYCIN HCL 150 MG PO CAPS: 300.0000 mg | ORAL_CAPSULE | Freq: Four times a day (QID) | ORAL | 0 refills | Status: DC

## 2017-03-19 MED ORDER — IBUPROFEN 600 MG PO TABS
600.0000 mg | ORAL_TABLET | Freq: Four times a day (QID) | ORAL | 0 refills | Status: DC | PRN
Start: 2017-03-19 — End: 2017-07-14

## 2017-03-19 NOTE — ED Provider Notes (Signed)
[facility]   CSN: [id number] Arrival date & time: 03/18/17  2128     History   Chief Complaint Chief Complaint  Patient presents with  . Dental Pain    HPI Krista Meza is a 26 y.o. female.  26 year old female presents to the emergency department for right-sided facial swelling and pain. She states that she has had swelling over the  past 72 hours with pain developing 48 hours ago. she states that pain is fairly constant and worse with palpation. She also notices aggravation of symptoms with chewing. Patient endorses intermittent associated paresthesias to her right lower lip as well as nasal drainage from the right nare. No epistaxis, fever, dental pain, trauma, inability to swallow, sore throat, or purulence or other drainage from the mouth.       Past Medical History:  Diagnosis Date  . Anxiety   . Asthma    as child, inhaler for a year  . Condyloma acuminata 10/14/2013   Vaginal white plaque approx 2.5 cm at base of introitus, smooth irregular borders. No pruritis. Biopsy postpartum.   Marland Kitchen. Heart murmur    palpitations  . Heart palpitations   . HSV (herpes simplex virus) anogenital infection   . Urinary tract infection     Patient Active Problem List   Diagnosis Date Noted  . 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 HSV (herpes simplex virus) anogenital infection   . Condyloma acuminata 10/14/2013  . Obesity (BMI 30-39.9) 09/17/2013  . Avitaminosis D 08/03/2011  . Chronic depression 07/31/2011  . Arthralgia of multiple joints 07/31/2011  . Intermittent pain 07/31/2011    Past Surgical History:  Procedure Laterality Date  . arm surgery Right   . CESAREAN SECTION  04/16/2011   Procedure: CESAREAN SECTION;  Surgeon: Roseanna RainbowLisa A Jackson-Moore, MD;  Location: [facility];  Service: Gynecology;  Laterality: N/A;    OB History    Gravida Para Term Preterm AB Living   2 2 2     2    SAB TAB Ectopic Multiple Live  Births           2       Home Medications    Prior to Admission medications   Medication Sig Start Date End Date Taking? Authorizing Provider  clindamycin (CLEOCIN) 150 MG capsule Take 2 capsules (300 mg total) by mouth 4 (four) times daily. May dispense as 150mg  capsules 03/19/17   Antony MaduraHumes, Kinley Ferrentino, PA-C  etonogestrel (IMPLANON) 68 MG IMPL implant Inject 1 each (68 mg total) into the skin once. 02/14/14   [name], Lisa, MD  ibuprofen (ADVIL,MOTRIN) 600 MG tablet Take 1 tablet (600 mg total) by mouth every 6 (six) hours as needed. 03/19/17   Antony MaduraHumes, Dustine Stickler, PA-C  valACYclovir (VALTREX) 500 MG tablet Take 1 tablet (500 mg total) by mouth daily. Can increase to twice a day for 5 days in the event of a recurrence 12/10/16   Anyanwu, Jethro BastosUgonna A, MD    Family History Family History  Problem Relation Age of Onset  . Anesthesia problems Neg Hx   . Hypotension Neg Hx   . Malignant hyperthermia Neg Hx   . Pseudochol deficiency Neg Hx     Social History Social History  Substance Use Topics  . Smoking status: Former Smoker    Types: Cigars    Quit date: 04/08/2013  . Smokeless tobacco: Never Used  . Alcohol use No     Allergies   Amoxicillin and  Penicillins   Review of Systems Review of Systems Ten systems reviewed and are negative for acute change, except as noted in the HPI.    Physical Exam Updated Vital Signs BP (!) 143/84 (BP Location: Right Arm)   Pulse 97   Temp (!) 97.5 F (36.4 C) (Oral)   Resp 16   Ht 5\' 2"  (1.575 m)   Wt 98.4 kg (217 lb)   LMP 02/16/2017   SpO2 100%   BMI 39.69 kg/m   Physical Exam  Constitutional: She is oriented to person, place, and time. She appears well-developed and well-nourished. No distress.  Nontoxic appearing and in no acute distress  HENT:  Head: Normocephalic and atraumatic.    Mouth/Throat: Oropharynx is clear and moist.  Mild tenderness to the base of the gingiva associated with the right lower canine and first premolar.  No gingival fluctuance or significant swelling. No drainage. No trismus. Soft oral floor. Symmetric jaw opening. No bony deformities. No crepitus.  Eyes: Conjunctivae and EOM are normal. No scleral icterus.  Neck: Normal range of motion.  No nuchal rigidity or meningismus  Cardiovascular: Normal rate, regular rhythm and intact distal pulses.   Pulmonary/Chest: Effort normal. No respiratory distress. She has no wheezes.  Respirations even and unlabored  Musculoskeletal: Normal range of motion.  Lymphadenopathy:    She has cervical adenopathy.  Neurological: She is alert and oriented to person, place, and time. She exhibits normal muscle tone. Coordination normal.  Ambulatory with steady gait  Skin: Skin is warm and dry. No rash noted. She is not diaphoretic. No erythema. No pallor.  Psychiatric: She has a normal mood and affect. Her behavior is normal.  Nursing note and vitals reviewed.    ED Treatments / Results  Labs (all labs ordered are listed, but only abnormal results are displayed) Labs Reviewed - No data to display  EKG  EKG Interpretation None       Radiology No results found.  Procedures Procedures (including critical care time)  Medications Ordered in ED Medications  clindamycin (CLEOCIN) capsule 300 mg (not administered)  ibuprofen (ADVIL,MOTRIN) tablet 800 mg (not administered)     Initial Impression / Assessment and Plan / ED Course  I have reviewed the triage vital signs and the nursing notes.  Pertinent labs & imaging results that were available during my care of the patient were reviewed by me and considered in my medical decision making (see chart for details).     26 year old female presents to the emergency department for lymphadenopathy x 3 days suspected secondary to infectious etiology. This corresponds with an area of tenderness to the right mandible x 2 days, at the base of the right lower canine and first premolar. No distinct abscess or  fever. No history of trauma. Plan to manage with antibiotics and NSAIDs. Patient to follow up with her dentist and/or PCP on an outpatient basis. Return precautions discussed and provided. Patient discharged in stable condition with no unaddressed concerns.   Final Clinical Impressions(s) / ED Diagnoses   Final diagnoses:  Facial pain, acute  Cervical lymphadenopathy    New Prescriptions New Prescriptions   CLINDAMYCIN (CLEOCIN) 150 MG CAPSULE    Take 2 capsules (300 mg total) by mouth 4 (four) times daily. May dispense as 150mg  capsules   IBUPROFEN (ADVIL,MOTRIN) 600 MG TABLET    Take 1 tablet (600 mg total) by mouth every 6 (six) hours as needed.     Antony Madura, PA-C 03/19/17 1610    Delo,  Riley Lam, MD 03/19/17 [id number]

## 2017-03-19 NOTE — Discharge Instructions (Signed)
Take ibuprofen for pain. You may apply ice as needed to limit swelling. Take clindamycin as prescribed until finished as your symptoms are suspected to be due to an infection. If pain persists, we advise follow up with a primary care doctor and/or a dentist. You may return to the ED for new or concerning symptoms.

## 2017-03-25 ENCOUNTER — Encounter: Payer: Self-pay | Admitting: Obstetrics and Gynecology

## 2017-06-24 ENCOUNTER — Other Ambulatory Visit (HOSPITAL_COMMUNITY)
Admission: RE | Admit: 2017-06-24 | Discharge: 2017-06-24 | Disposition: A | Discharge status: Home / Self Care | Payer: Medicaid Other | Source: Ambulatory Visit | Attending: Obstetrics and Gynecology | Admitting: Obstetrics and Gynecology

## 2017-06-24 ENCOUNTER — Encounter: Payer: Self-pay | Admitting: Obstetrics and Gynecology

## 2017-06-24 ENCOUNTER — Ambulatory Visit: Payer: Medicaid Other | Admitting: Obstetrics and Gynecology

## 2017-06-24 VITALS — BP 120/83 | HR 88 | Wt 225.9 lb

## 2017-06-24 DIAGNOSIS — N871 Moderate cervical dysplasia: Secondary | ICD-10-CM | POA: Insufficient documentation

## 2017-06-24 DIAGNOSIS — Z113 Encounter for screening for infections with a predominantly sexual mode of transmission: Secondary | ICD-10-CM

## 2017-06-24 DIAGNOSIS — D069 Carcinoma in situ of cervix, unspecified: Secondary | ICD-10-CM

## 2017-06-24 DIAGNOSIS — Z202 Contact with and (suspected) exposure to infections with a predominantly sexual mode of transmission: Secondary | ICD-10-CM

## 2017-06-24 MED ORDER — AZITHROMYCIN 500 MG PO TABS: 1000.0000 mg | ORAL_TABLET | Freq: Once | ORAL | 0 refills | Status: DC

## 2017-06-24 NOTE — Progress Notes (Signed)
Azithromycin 1 g sent to pharmacy for known exposure to chlamydia.  Gonorrhea/chlamydia, HIV, hepatitis B/C, RPR sent today at patient request   K. Therese SarahMeryl Davis, M.D. Attending Obstetrician & Gynecologist, [facility], [facility]

## 2017-06-24 NOTE — Progress Notes (Signed)
     GYNECOLOGY OFFICE PROCEDURE NOTE  Lucky RathkeKhalilah V Morey is a 27 y.o. [phone] [id number] here for LEEP. Reviewed findings on pap/colpo. Reviewed diagnosis and prognosis. 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 Reviewed risks/benefits of both including cryotherapy being less invasive but also not a excisional procedure and possible need for repeat excisional procedure. Reviewed that LEEP is more invasive procedure but more definitive in that it removes abnormal tissue. Reviewed risks including infection, hemorrhage, damage to surrounding tissues and organs. Patient opts for LEEP. Instructions reviewed. No GYN concerns. Pap smear and colposcopy reviewed.   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  Role of HPV,cervical dysplasia and need for close followup was empasized. Informed written consent was obtained. All questions were answered. Time out performed. Urine pregnancy test was negative.  Procedure: The patient was placed in lithotomy position and the bivalved coated speculum was placed in the patient's vagina. A grounding pad placed on the patient. Acetic acid was applied to the cervix and areas of decreased uptake were noted around the transformation zone.  Local anesthesia was administered via an intracervical block using 10 ml of 1% Lidocaine with epinephrine. The suction was turned on and the Small 1X Fisher Cone Biopsy Excisor on 9[address] of blended current was used to excise the area of decreased uptake and excise the entire transformation zone. Excellent hemostasis was noted. Monsel's solution was then applied and the speculum was removed from the vagina. Specimens were sent to pathology.  The patient tolerated  the procedure well. Post-operative instructions given to patient, including instruction to seek medical attention for persistent bright red bleeding, fever, abdominal/pelvic pain, dysuria, nausea or vomiting. She was also told about the possibility of having copious yellow to black tinged discharge for weeks. She was counseled to avoid anything in the vagina (sex/douching/tampons) for 3 weeks. She has a 4 week post-operative check to assess wound healing, review results and discuss further management.   Baldemar LenisK. Meryl Davis, M.D. Attending Obstetrician & Gynecologist, [facility], [facility]

## 2017-06-25 ENCOUNTER — Encounter: Payer: Self-pay | Admitting: *Deleted

## 2017-06-28 LAB — CERVICOVAGINAL ANCILLARY ONLY
BACTERIAL VAGINITIS: NEGATIVE
CHLAMYDIA, DNA PROBE: POSITIVE — AB
Candida vaginitis: NEGATIVE
NEISSERIA GONORRHEA: NEGATIVE
TRICH (WINDOWPATH): NEGATIVE

## 2017-06-29 ENCOUNTER — Other Ambulatory Visit: Payer: Self-pay

## 2017-06-29 ENCOUNTER — Encounter: Payer: Self-pay | Admitting: Obstetrics and Gynecology

## 2017-06-29 DIAGNOSIS — A749 Chlamydial infection, unspecified: Secondary | ICD-10-CM

## 2017-06-29 MED ORDER — AZITHROMYCIN 500 MG PO TABS
1000.0000 mg | ORAL_TABLET | Freq: Once | ORAL | 0 refills | Status: AC
Start: 2017-06-29 — End: 2017-06-29

## 2017-07-02 ENCOUNTER — Encounter: Payer: Self-pay | Admitting: Obstetrics and Gynecology

## 2017-07-05 ENCOUNTER — Telehealth: Payer: Self-pay

## 2017-07-05 NOTE — Telephone Encounter (Signed)
Returned call and pt had questions about results. 

## 2017-07-05 NOTE — Telephone Encounter (Signed)
Returned call, no answer. Left vm

## 2017-07-09 ENCOUNTER — Telehealth: Payer: Self-pay

## 2017-07-09 NOTE — Telephone Encounter (Signed)
Returned call and pt stated that she has been having bright red heavy bleeding since 07-02-17 and it has not lightened up. Pt had LEEP done on 06-24-16.  Checked with in office provider and advised pt to be evaluated at hospital.

## 2017-07-14 ENCOUNTER — Encounter (HOSPITAL_COMMUNITY): Payer: Self-pay | Admitting: *Deleted

## 2017-07-14 ENCOUNTER — Inpatient Hospital Stay (HOSPITAL_COMMUNITY)
Admission: AD | Admit: 2017-07-14 | Discharge: 2017-07-14 | Disposition: A | Discharge status: Home / Self Care | Payer: Medicaid Other | Source: Ambulatory Visit | Attending: Obstetrics & Gynecology | Admitting: Obstetrics & Gynecology

## 2017-07-14 ENCOUNTER — Other Ambulatory Visit: Payer: Self-pay

## 2017-07-14 DIAGNOSIS — Z978 Presence of other specified devices: Secondary | ICD-10-CM

## 2017-07-14 DIAGNOSIS — Z79899 Other long term (current) drug therapy: Secondary | ICD-10-CM | POA: Insufficient documentation

## 2017-07-14 DIAGNOSIS — Z880 Allergy status to penicillin: Secondary | ICD-10-CM | POA: Insufficient documentation

## 2017-07-14 DIAGNOSIS — Z87891 Personal history of nicotine dependence: Secondary | ICD-10-CM | POA: Diagnosis not present

## 2017-07-14 DIAGNOSIS — N939 Abnormal uterine and vaginal bleeding, unspecified: Secondary | ICD-10-CM | POA: Diagnosis not present

## 2017-07-14 DIAGNOSIS — N921 Excessive and frequent menstruation with irregular cycle: Secondary | ICD-10-CM

## 2017-07-14 DIAGNOSIS — R109 Unspecified abdominal pain: Secondary | ICD-10-CM | POA: Diagnosis present

## 2017-07-14 DIAGNOSIS — Z975 Presence of (intrauterine) contraceptive device: Secondary | ICD-10-CM

## 2017-07-14 DIAGNOSIS — Z9889 Other specified postprocedural states: Secondary | ICD-10-CM | POA: Diagnosis not present

## 2017-07-14 HISTORY — DX: Unspecified abnormal cytological findings in specimens from vagina: R87.629

## 2017-07-14 HISTORY — DX: Headache, unspecified: R51.9

## 2017-07-14 HISTORY — DX: Depression, unspecified: F32.A

## 2017-07-14 HISTORY — DX: Major depressive disorder, single episode, unspecified: F32.9

## 2017-07-14 HISTORY — DX: Headache: R51

## 2017-07-14 LAB — CBC
HEMATOCRIT: 39.1 % (ref 36.0–46.0)
Hemoglobin: 12.7 g/dL (ref 12.0–15.0)
MCH: 29.3 pg (ref 26.0–34.0)
MCHC: 32.5 g/dL (ref 30.0–36.0)
MCV: 90.3 fL (ref 78.0–100.0)
Platelets: 283 10*3/uL (ref 150–400)
RBC: 4.33 MIL/uL (ref 3.87–5.11)
RDW: 13.3 % (ref 11.5–15.5)
WBC: 7 10*3/uL (ref 4.0–10.5)

## 2017-07-14 LAB — URINALYSIS, ROUTINE W REFLEX MICROSCOPIC
Bilirubin Urine: NEGATIVE
GLUCOSE, UA: NEGATIVE mg/dL
Ketones, ur: NEGATIVE mg/dL
NITRITE: NEGATIVE
Protein, ur: NEGATIVE mg/dL
SPECIFIC GRAVITY, URINE: 1.014 (ref 1.005–1.030)
pH: 6 (ref 5.0–8.0)

## 2017-07-14 LAB — POCT PREGNANCY, URINE: Preg Test, Ur: NEGATIVE

## 2017-07-14 MED ORDER — IBUPROFEN 600 MG PO TABS: 600.0000 mg | ORAL_TABLET | Freq: Four times a day (QID) | ORAL | 0 refills | Status: DC | PRN

## 2017-07-14 NOTE — MAU Provider Note (Signed)
History     CSN: [id number]  Arrival date and time: 07/14/17 0347   First Provider Initiated Contact with Patient 07/14/17 0831      Chief Complaint  Patient presents with  . Vaginal Bleeding  . Abdominal Pain   HPI  [name] is a 27 y.o. [id number] who presents to MAU today with complaint of vaginal bleeding. The patient had a LEEP on 06/24/17. She also had her Nexplanon removed and replaced in early January. She states minimal bleeding after LEEP and then on 07/02/17 she started bleeding and has had light bleeding every day since then. She is only using a panty liner and changes q 2-3 hours. She denies any heavy bleeding or clots. She denies fever.    OB History    Gravida Para Term Preterm AB Living   2 2 2     2    SAB TAB Ectopic Multiple Live Births           2      Past Medical History:  Diagnosis Date  . Anxiety   . Asthma    as child, inhaler for a year  . Condyloma acuminata 10/14/2013   Vaginal white plaque approx 2.5 cm at base of introitus, smooth irregular borders. No pruritis. Biopsy postpartum.   . Depression    doing ok  . Headache   . Heart murmur    palpitations  . Heart palpitations   . HSV (herpes simplex virus) anogenital infection   . Urinary tract infection   . Vaginal Pap smear, abnormal    Leep Jan 2019    Past Surgical History:  Procedure Laterality Date  . arm surgery Right   . CESAREAN SECTION  04/16/2011   Procedure: CESAREAN SECTION;  Surgeon: Roseanna Rainbow, MD;  Location: [facility];  Service: Gynecology;  Laterality: N/A;  . LEEP    . WISDOM TOOTH EXTRACTION      Family History  Problem Relation Age of Onset  . Hypertension Mother   . Cancer Father        thyroid  . Diabetes Father   . Hypertension Maternal Grandmother   . Arthritis Maternal Grandmother   . Heart disease Paternal Grandmother   . Arthritis Paternal Grandmother   . Anesthesia problems Neg Hx   . Hypotension Neg Hx   . Malignant hyperthermia  Neg Hx   . Pseudochol deficiency Neg Hx     Social History   Tobacco Use  . Smoking status: Former Smoker    Types: Cigars    Last attempt to quit: 04/08/2013    Years since quitting: 4.2  . Smokeless tobacco: Never Used  Substance Use Topics  . Alcohol use: No  . Drug use: No    Allergies:  Allergies  Allergen Reactions  . Amoxicillin Hives    Has patient had a PCN reaction causing immediate rash, facial/tongue/throat swelling, SOB or lightheadedness with hypotension: Yes Has patient had a PCN reaction causing severe rash involving mucus membranes or skin necrosis: No Has patient had a PCN reaction that required hospitalization: No Has patient had a PCN reaction occurring within the last 10 years: No If all of the above answers are "NO", then may proceed with Cephalosporin use.   Marland Kitchen Penicillins Hives    Has patient had a PCN reaction causing immediate rash, facial/tongue/throat swelling, SOB or lightheadedness with hypotension: Yes Has patient had a PCN reaction causing severe rash involving mucus membranes or skin necrosis: No Has patient  had a PCN reaction that required hospitalization: No Has patient had a PCN reaction occurring within the last 10 years: No If all of the above answers are "NO", then may proceed with Cephalosporin use.     Medications Prior to Admission  Medication Sig Dispense Refill Last Dose  . clindamycin (CLEOCIN) 150 MG capsule Take 2 capsules (300 mg total) by mouth 4 (four) times daily. May dispense as 150mg  capsules (Patient not taking: Reported on 06/24/2017) 56 capsule 0 Not Taking  . etonogestrel (IMPLANON) 68 MG IMPL implant Inject 1 each (68 mg total) into the skin once. 1 each 0 Taking  . valACYclovir (VALTREX) 500 MG tablet Take 1 tablet (500 mg total) by mouth daily. Can increase to twice a day for 5 days in the event of a recurrence (Patient not taking: Reported on 06/24/2017) 30 tablet 12 Not Taking    Review of Systems  Constitutional:  Negative for fever.  Gastrointestinal: Negative for abdominal pain, constipation, diarrhea, nausea and vomiting.  Genitourinary: Positive for pelvic pain and vaginal bleeding. Negative for vaginal discharge.   Physical Exam   Blood pressure 118/76, pulse 80, temperature 98.8 F (37.1 C), temperature source Oral, resp. rate 17, weight 227 lb 8 oz (103.2 kg), SpO2 99 %.  Physical Exam  Nursing note and vitals reviewed. Constitutional: She is oriented to person, place, and time. She appears well-developed and well-nourished. No distress.  HENT:  Head: Normocephalic and atraumatic.  Cardiovascular: Normal rate.  Respiratory: Effort normal.  GI: Soft. She exhibits no distension.  Genitourinary: Cervix exhibits no discharge and no friability. There is bleeding (scant) in the vagina. No vaginal discharge found.  Genitourinary Comments: Cervix: normal contour, no friability, well-healed from LEEP.   Neurological: She is alert and oriented to person, place, and time.  Skin: Skin is warm and dry. No erythema.  Psychiatric: She has a normal mood and affect.    Results for orders placed or performed during the hospital encounter of 07/14/17 (from the past 24 hour(s))  CBC     Status: None   Collection Time: 07/14/17  8:30 AM  Result Value Ref Range   WBC 7.0 4.0 - 10.5 K/uL   RBC 4.33 3.87 - 5.11 MIL/uL   Hemoglobin 12.7 12.0 - 15.0 g/dL   HCT 52.839.1 41.336.0 - 24.446.0 %   MCV 90.3 78.0 - 100.0 fL   MCH 29.3 26.0 - 34.0 pg   MCHC 32.5 30.0 - 36.0 g/dL   RDW 01.013.3 27.211.5 - 53.615.5 %   Platelets 283 150 - 400 K/uL  Pregnancy, urine POC     Status: None   Collection Time: 07/14/17  8:36 AM  Result Value Ref Range   Preg Test, Ur NEGATIVE NEGATIVE    MAU Course  Procedures None  MDM UPT - Negative CBC today   Assessment and Plan  A: Breakthrough bleeding with Nexplanon  S/P LEEP  P: Discharge home Rx for Ibuprofen given to patient  Advised to take on schedule for the next 5-7 days for  bleeding  Bleeding precautions discussed Patient advised to follow-up with [facility] as scheduled for follow-up after LEEP next week Patient may return to MAU as needed or if her condition were to change or worsen  Vonzella NippleJulie Libni Fusaro, PA-C 07/14/2017, 8:52 AM

## 2017-07-14 NOTE — Discharge Instructions (Signed)
Etonogestrel implant What is this medicine? ETONOGESTREL (et oh noe JES trel) is a contraceptive (birth control) device. It is used to prevent pregnancy. It can be used for up to 3 years. This medicine may be used for other purposes; ask your health care provider or pharmacist if you have questions. COMMON BRAND NAME(S): Implanon, Nexplanon What should I tell my health care provider before I take this medicine? They need to know if you have any of these conditions: -abnormal vaginal bleeding -blood vessel disease or blood clots -cancer of the breast, cervix, or liver -depression -diabetes -gallbladder disease -headaches -heart disease or recent heart attack -high blood pressure -high cholesterol -kidney disease -liver disease -renal disease -seizures -tobacco smoker -an unusual or allergic reaction to etonogestrel, other hormones, anesthetics or antiseptics, medicines, foods, dyes, or preservatives -pregnant or trying to get pregnant -breast-feeding How should I use this medicine? This device is inserted just under the skin on the inner side of your upper arm by a health care professional. Talk to your pediatrician regarding the use of this medicine in children. Special care may be needed. Overdosage: If you think you have taken too much of this medicine contact a poison control center or emergency room at once. NOTE: This medicine is only for you. Do not share this medicine with others. What if I miss a dose? This does not apply. What may interact with this medicine? Do not take this medicine with any of the following medications: -amprenavir -bosentan -fosamprenavir This medicine may also interact with the following medications: -barbiturate medicines for inducing sleep or treating seizures -certain medicines for fungal infections like ketoconazole and itraconazole -grapefruit juice -griseofulvin -medicines to treat seizures like carbamazepine, felbamate, oxcarbazepine,  phenytoin, topiramate -modafinil -phenylbutazone -rifampin -rufinamide -some medicines to treat HIV infection like atazanavir, indinavir, lopinavir, nelfinavir, tipranavir, ritonavir -St. John's wort This list may not describe all possible interactions. Give your health care provider a list of all the medicines, herbs, non-prescription drugs, or dietary supplements you use. Also tell them if you smoke, drink alcohol, or use illegal drugs. Some items may interact with your medicine. What should I watch for while using this medicine? This product does not protect you against HIV infection (AIDS) or other sexually transmitted diseases. You should be able to feel the implant by pressing your fingertips over the skin where it was inserted. Contact your doctor if you cannot feel the implant, and use a non-hormonal birth control method (such as condoms) until your doctor confirms that the implant is in place. If you feel that the implant may have broken or become bent while in your arm, contact your healthcare provider. What side effects may I notice from receiving this medicine? Side effects that you should report to your doctor or health care professional as soon as possible: -allergic reactions like skin rash, itching or hives, swelling of the face, lips, or tongue -breast lumps -changes in emotions or moods -depressed mood -heavy or prolonged menstrual bleeding -pain, irritation, swelling, or bruising at the insertion site -scar at site of insertion -signs of infection at the insertion site such as fever, and skin redness, pain or discharge -signs of pregnancy -signs and symptoms of a blood clot such as breathing problems; changes in vision; chest pain; severe, sudden headache; pain, swelling, warmth in the leg; trouble speaking; sudden numbness or weakness of the face, arm or leg -signs and symptoms of liver injury like dark yellow or brown urine; general ill feeling or flu-like symptoms;  light-colored   stools; loss of appetite; nausea; right upper belly pain; unusually weak or tired; yellowing of the eyes or skin -unusual vaginal bleeding, discharge -signs and symptoms of a stroke like changes in vision; confusion; trouble speaking or understanding; severe headaches; sudden numbness or weakness of the face, arm or leg; trouble walking; dizziness; loss of balance or coordination Side effects that usually do not require medical attention (report to your doctor or health care professional if they continue or are bothersome): -acne -back pain -breast pain -changes in weight -dizziness -general ill feeling or flu-like symptoms -headache -irregular menstrual bleeding -nausea -sore throat -vaginal irritation or inflammation This list may not describe all possible side effects. Call your doctor for medical advice about side effects. You may report side effects to FDA at 1-800-FDA-1088. Where should I keep my medicine? This drug is given in a hospital or clinic and will not be stored at home. NOTE: This sheet is a summary. It may not cover all possible information. If you have questions about this medicine, talk to your doctor, pharmacist, or health care provider.  2018 Elsevier/Gold Standard (2015-11-28 11:19:22)  

## 2017-07-14 NOTE — MAU Note (Signed)
Had LEEP at 1/30.  Wk later started cramping and bleeding. On Friday, was told to come here.  Changing a pantyliner every 3-4 hours. Cramping in lower abd. Denies fever.

## 2017-07-22 ENCOUNTER — Ambulatory Visit: Payer: Medicaid Other | Admitting: Obstetrics and Gynecology

## 2017-08-10 ENCOUNTER — Encounter: Payer: Self-pay | Admitting: Obstetrics and Gynecology

## 2017-08-10 ENCOUNTER — Ambulatory Visit (INDEPENDENT_AMBULATORY_CARE_PROVIDER_SITE_OTHER): Payer: Medicaid Other | Admitting: Obstetrics and Gynecology

## 2017-08-10 ENCOUNTER — Other Ambulatory Visit (HOSPITAL_COMMUNITY)
Admission: RE | Admit: 2017-08-10 | Discharge: 2017-08-10 | Disposition: A | Discharge status: Home / Self Care | Payer: Medicaid Other | Source: Ambulatory Visit | Attending: Obstetrics and Gynecology | Admitting: Obstetrics and Gynecology

## 2017-08-10 VITALS — BP 123/81 | HR 88 | Wt 224.7 lb

## 2017-08-10 DIAGNOSIS — Z202 Contact with and (suspected) exposure to infections with a predominantly sexual mode of transmission: Secondary | ICD-10-CM | POA: Diagnosis not present

## 2017-08-10 DIAGNOSIS — D069 Carcinoma in situ of cervix, unspecified: Secondary | ICD-10-CM

## 2017-08-10 NOTE — Progress Notes (Signed)
GYNECOLOGY OFFICE FOLLOW UP NOTE  History:  27 y.o. [id number] here today for follow up from LEEP 06/24/17. Has been doing well. Did attend [facility] last week for some break through bleeding with cramping which stopped suddenly and has had none since.  Past Medical History:  Diagnosis Date  . Anxiety   . Asthma    as child, inhaler for a year  . Condyloma acuminata 10/14/2013   Vaginal white plaque approx 2.5 cm at base of introitus, smooth irregular borders. No pruritis. Biopsy postpartum.   . Depression    doing ok  . Headache   . Heart murmur    palpitations  . Heart palpitations   . HSV (herpes simplex virus) anogenital infection   . Urinary tract infection   . Vaginal Pap smear, abnormal    Leep Jan 2019    Past Surgical History:  Procedure Laterality Date  . arm surgery Right   . CESAREAN SECTION  04/16/2011   Procedure: CESAREAN SECTION;  Surgeon: Roseanna RainbowLisa A Jackson-Moore, MD;  Location: [facility];  Service: Gynecology;  Laterality: N/A;  . LEEP    . WISDOM TOOTH EXTRACTION       Current Outpatient Medications:  .  etonogestrel (IMPLANON) 68 MG IMPL implant, Inject 1 each (68 mg total) into the skin once., Disp: 1 each, Rfl: 0 .  clindamycin (CLEOCIN) 150 MG capsule, Take 2 capsules (300 mg total) by mouth 4 (four) times daily. May dispense as 150mg  capsules (Patient not taking: Reported on 06/24/2017), Disp: 56 capsule, Rfl: 0 .  ibuprofen (ADVIL,MOTRIN) 600 MG tablet, Take 1 tablet (600 mg total) by mouth every 6 (six) hours as needed. (Patient not taking: Reported on 08/10/2017), Disp: 30 tablet, Rfl: 0 .  valACYclovir (VALTREX) 500 MG tablet, Take 1 tablet (500 mg total) by mouth daily. Can increase to twice a day for 5 days in the event of a recurrence (Patient not taking: Reported on 06/24/2017), Disp: 30 tablet, Rfl: 12  The following portions of the patient's history were reviewed and updated as appropriate: allergies, current medications, past family history, past medical  history, past social history, past surgical history and problem list.   Review of Systems:  Pertinent items noted in HPI and remainder of comprehensive ROS otherwise negative.   Objective:  Physical Exam BP 123/81   Pulse 88   Wt 224 lb 11.2 oz (101.9 kg)   LMP 07/16/2017   BMI 41.10 kg/m  CONSTITUTIONAL: Well-developed, well-nourished female in no acute distress.  HENT:  Normocephalic, atraumatic. External right and left ear normal. Oropharynx is clear and moist EYES: Conjunctivae and EOM are normal. Pupils are equal, round, and reactive to light. No scleral icterus.  NECK: Normal range of motion, supple, no masses SKIN: Skin is warm and dry. No rash noted. Not diaphoretic. No erythema. No pallor. NEUROLOGIC: Alert and oriented to person, place, and time. Normal reflexes, muscle tone coordination. No cranial nerve deficit noted. PSYCHIATRIC: Normal mood and affect. Normal behavior. Normal judgment and thought content. CARDIOVASCULAR: Normal heart rate noted RESPIRATORY: Effort and breath sounds normal, no problems with respiration noted ABDOMEN: Soft, no distention noted.   PELVIC: normal appearing external genitalia, normal appearing vaginal mucosa, cervix appears to be well healing with no bleeding ntoed MUSCULOSKELETAL: Normal range of motion. No edema noted. 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 Dysplasia extends to the cauterized edges and involves endocervical glands. Manning Charity MD Pathologist, Electronic Signature (Case signed 06/28/2017)  Assessment & Plan:   1. Exposure to STD TOC today - Cervicovaginal ancillary only  2. Carcinoma in situ of cervix, unspecified location Patient healing well Reviewed CIN II/III diagnosis with patient and  that margins were positive, reviewed options for repeat pap smear in 4 months or repeat LEEP. Given her young age and desired fertility, would recommend repeat pap + ECC and proceed based on findings. She verbalizes understanding that abnormality may progress or regress in intervening time period. She is agreeable to plan  Routine preventative health maintenance measures emphasized. Please refer to After Visit Summary for other counseling recommendations.   Return in about 2 months (around 10/22/2017).    Baldemar Lenis, M.D. Attending Obstetrician & Gynecologist, [facility] for [employer], [facility]

## 2017-08-11 LAB — CERVICOVAGINAL ANCILLARY ONLY
BACTERIAL VAGINITIS: NEGATIVE
CANDIDA VAGINITIS: NEGATIVE
CHLAMYDIA, DNA PROBE: NEGATIVE
NEISSERIA GONORRHEA: NEGATIVE
Trichomonas: NEGATIVE

## 2017-08-13 ENCOUNTER — Encounter: Payer: Self-pay | Admitting: Obstetrics and Gynecology

## 2017-08-17 ENCOUNTER — Other Ambulatory Visit: Payer: Self-pay | Admitting: Obstetrics and Gynecology

## 2017-08-17 DIAGNOSIS — D069 Carcinoma in situ of cervix, unspecified: Secondary | ICD-10-CM

## 2017-08-17 NOTE — Progress Notes (Signed)
Patient preference for second opinion for Gyn Oncology at [facility], referral sent.

## 2017-09-22 ENCOUNTER — Encounter: Payer: Self-pay | Admitting: Obstetrics & Gynecology

## 2017-09-22 ENCOUNTER — Ambulatory Visit (INDEPENDENT_AMBULATORY_CARE_PROVIDER_SITE_OTHER): Payer: Medicaid Other | Admitting: Obstetrics & Gynecology

## 2017-09-22 VITALS — BP 109/75 | HR 88 | Ht 62.0 in | Wt 226.0 lb

## 2017-09-22 DIAGNOSIS — Z3046 Encounter for surveillance of implantable subdermal contraceptive: Secondary | ICD-10-CM

## 2017-09-22 DIAGNOSIS — E559 Vitamin D deficiency, unspecified: Secondary | ICD-10-CM

## 2017-09-22 MED ORDER — VITAMIN D 1000 UNITS PO TABS: 1000.0000 [IU] | ORAL_TABLET | Freq: Every day | ORAL | 6 refills | Status: DC

## 2017-09-22 NOTE — Patient Instructions (Signed)
Etonogestrel implant What is this medicine? ETONOGESTREL (et oh noe JES trel) is a contraceptive (birth control) device. It is used to prevent pregnancy. It can be used for up to 3 years. This medicine may be used for other purposes; ask your health care provider or pharmacist if you have questions. COMMON BRAND NAME(S): Implanon, Nexplanon What should I tell my health care provider before I take this medicine? They need to know if you have any of these conditions: -abnormal vaginal bleeding -blood vessel disease or blood clots -cancer of the breast, cervix, or liver -depression -diabetes -gallbladder disease -headaches -heart disease or recent heart attack -high blood pressure -high cholesterol -kidney disease -liver disease -renal disease -seizures -tobacco smoker -an unusual or allergic reaction to etonogestrel, other hormones, anesthetics or antiseptics, medicines, foods, dyes, or preservatives -pregnant or trying to get pregnant -breast-feeding How should I use this medicine? This device is inserted just under the skin on the inner side of your upper arm by a health care professional. Talk to your pediatrician regarding the use of this medicine in children. Special care may be needed. Overdosage: If you think you have taken too much of this medicine contact a poison control center or emergency room at once. NOTE: This medicine is only for you. Do not share this medicine with others. What if I miss a dose? This does not apply. What may interact with this medicine? Do not take this medicine with any of the following medications: -amprenavir -bosentan -fosamprenavir This medicine may also interact with the following medications: -barbiturate medicines for inducing sleep or treating seizures -certain medicines for fungal infections like ketoconazole and itraconazole -grapefruit juice -griseofulvin -medicines to treat seizures like carbamazepine, felbamate, oxcarbazepine,  phenytoin, topiramate -modafinil -phenylbutazone -rifampin -rufinamide -some medicines to treat HIV infection like atazanavir, indinavir, lopinavir, nelfinavir, tipranavir, ritonavir -St. John's wort This list may not describe all possible interactions. Give your health care provider a list of all the medicines, herbs, non-prescription drugs, or dietary supplements you use. Also tell them if you smoke, drink alcohol, or use illegal drugs. Some items may interact with your medicine. What should I watch for while using this medicine? This product does not protect you against HIV infection (AIDS) or other sexually transmitted diseases. You should be able to feel the implant by pressing your fingertips over the skin where it was inserted. Contact your doctor if you cannot feel the implant, and use a non-hormonal birth control method (such as condoms) until your doctor confirms that the implant is in place. If you feel that the implant may have broken or become bent while in your arm, contact your healthcare provider. What side effects may I notice from receiving this medicine? Side effects that you should report to your doctor or health care professional as soon as possible: -allergic reactions like skin rash, itching or hives, swelling of the face, lips, or tongue -breast lumps -changes in emotions or moods -depressed mood -heavy or prolonged menstrual bleeding -pain, irritation, swelling, or bruising at the insertion site -scar at site of insertion -signs of infection at the insertion site such as fever, and skin redness, pain or discharge -signs of pregnancy -signs and symptoms of a blood clot such as breathing problems; changes in vision; chest pain; severe, sudden headache; pain, swelling, warmth in the leg; trouble speaking; sudden numbness or weakness of the face, arm or leg -signs and symptoms of liver injury like dark yellow or brown urine; general ill feeling or flu-like symptoms;  light-colored   stools; loss of appetite; nausea; right upper belly pain; unusually weak or tired; yellowing of the eyes or skin -unusual vaginal bleeding, discharge -signs and symptoms of a stroke like changes in vision; confusion; trouble speaking or understanding; severe headaches; sudden numbness or weakness of the face, arm or leg; trouble walking; dizziness; loss of balance or coordination Side effects that usually do not require medical attention (report to your doctor or health care professional if they continue or are bothersome): -acne -back pain -breast pain -changes in weight -dizziness -general ill feeling or flu-like symptoms -headache -irregular menstrual bleeding -nausea -sore throat -vaginal irritation or inflammation This list may not describe all possible side effects. Call your doctor for medical advice about side effects. You may report side effects to FDA at 1-800-FDA-1088. Where should I keep my medicine? This drug is given in a hospital or clinic and will not be stored at home. NOTE: This sheet is a summary. It may not cover all possible information. If you have questions about this medicine, talk to your doctor, pharmacist, or health care provider.  2018 Elsevier/Gold Standard (2015-11-28 11:19:22)  

## 2017-09-22 NOTE — Progress Notes (Signed)
Presents for Nexplanon removal.  Informed Consent signed. GYNECOLOGY OFFICE PROCEDURE NOTE  LAN ENTSMINGER is a 27 y.o. [phone] here for Nexplanon removal.  Last pap smear was on 11/2016 and was abnormal.  CIN 2-3 on LEEP and needs f/u repeat pap    Nexplanon Removal Patient identified, informed consent performed, consent signed.   Appropriate time out taken. Nexplanon site identified.  Area prepped in usual sterile fashon. One ml of 1% lidocaine was used to anesthetize the area at the distal end of the implant. A small stab incision was made right beside the implant on the distal portion.  The Nexplanon rod was grasped using hemostats and removed without difficulty.  There was minimal blood loss. There were no complications.  3 ml of 1% lidocaine was injected around the incision for post-procedure analgesia.  Steri-strips were applied over the small incision.  A pressure bandage was applied to reduce any bruising.  The patient tolerated the procedure well and was given post procedure instructions.  Patient is planning to use nothing for contraception.       Adam Phenix, MD Attending Obstetrician & Gynecologist, [facility] for [facility]  09/22/2017

## 2017-09-24 ENCOUNTER — Encounter: Payer: Self-pay | Admitting: *Deleted

## 2017-12-21 ENCOUNTER — Telehealth: Payer: Self-pay | Admitting: *Deleted

## 2017-12-21 NOTE — Telephone Encounter (Signed)
Called patient to notify that her Medicaid Family Planning will not cover this visit an she would be required to pay when she comes in unless she has other insurance.Marland Kitchen..Marland Kitchen

## 2017-12-22 ENCOUNTER — Ambulatory Visit: Payer: Medicaid Other | Admitting: Obstetrics and Gynecology

## 2017-12-24 ENCOUNTER — Other Ambulatory Visit: Payer: Self-pay

## 2017-12-24 ENCOUNTER — Ambulatory Visit: Payer: Medicaid Other | Admitting: Certified Nurse Midwife

## 2017-12-24 ENCOUNTER — Other Ambulatory Visit (HOSPITAL_COMMUNITY)
Admission: RE | Admit: 2017-12-24 | Discharge: 2017-12-24 | Disposition: A | Discharge status: Home / Self Care | Payer: Medicaid Other | Source: Ambulatory Visit | Attending: Obstetrics and Gynecology | Admitting: Obstetrics and Gynecology

## 2017-12-24 ENCOUNTER — Encounter: Payer: Self-pay | Admitting: Certified Nurse Midwife

## 2017-12-24 VITALS — BP 115/75 | HR 90 | Wt 226.0 lb

## 2017-12-24 DIAGNOSIS — R1030 Lower abdominal pain, unspecified: Secondary | ICD-10-CM

## 2017-12-24 DIAGNOSIS — N3000 Acute cystitis without hematuria: Secondary | ICD-10-CM | POA: Diagnosis not present

## 2017-12-24 DIAGNOSIS — Z3202 Encounter for pregnancy test, result negative: Secondary | ICD-10-CM

## 2017-12-24 LAB — POCT URINE PREGNANCY: Preg Test, Ur: NEGATIVE

## 2017-12-24 LAB — POCT URINALYSIS DIPSTICK
Blood, UA: NEGATIVE
Glucose, UA: NEGATIVE
Nitrite, UA: NEGATIVE
Protein, UA: POSITIVE — AB
Spec Grav, UA: 1.025 (ref 1.010–1.025)
Urobilinogen, UA: 0.2 E.U./dL
pH, UA: 5 (ref 5.0–8.0)

## 2017-12-24 MED ORDER — SULFAMETHOXAZOLE-TRIMETHOPRIM 800-160 MG PO TABS
1.0000 | ORAL_TABLET | Freq: Two times a day (BID) | ORAL | 0 refills | Status: DC
Start: 2017-12-24 — End: 2018-01-03

## 2017-12-24 NOTE — Progress Notes (Signed)
PROBLEM VISIT ENCOUNTER NOTE  Subjective:   Krista Meza is a 27 y.o. [id number] female here for a problem visit.  Current complaints: lower abdominal cramping that has been occurring for 1 month, she reports pain occurs when she is not on her cycle but worsens during a cycle. Rates pain 7/10- takes ibuprofen at home for pain with some relief. She describes the pain as cramping that is sometimes sharp. She reports a hx of a LEEP procedure in January 2019 and PAP with biopsy and Nexplanon removal in May of 2019. She reports no periods occurred when she had Nexplanon in place and since having it removed lower abdominal cramping occurs.  Denies abnormal vaginal bleeding, discharge, pelvic pain, problems with intercourse or other gynecologic concerns. She reports "sweet smell and dark color" to her urine which she states when that occurs she has a UTI.    Gynecologic History Patient's last menstrual period was 12/06/2017. Contraception: none Last Pap: May 2019. LEEP procedure January 2019.  Obstetric History OB History  Gravida Para Term Preterm AB Living  2 2 2     2   SAB TAB Ectopic Multiple Live Births          2    # Outcome Date GA Lbr Len/2nd Weight Sex Delivery Anes PTL Lv  2 Term 12/23/13 [redacted]w[redacted]d 04:08 / 00:28 6 lb 12.3 oz (3.07 kg) F VBAC EPI  LIV  1 Term 04/16/11 [redacted]w[redacted]d 15:35 / 05:35 6 lb 14.1 oz (3.12 kg) F CS-LVertical EPI  LIV    Past Medical History:  Diagnosis Date  . Anxiety   . Asthma    as child, inhaler for a year  . Condyloma acuminata 10/14/2013   Vaginal white plaque approx 2.5 cm at base of introitus, smooth irregular borders. No pruritis. Biopsy postpartum.   . Depression    doing ok  . Headache   . Heart murmur    palpitations  . Heart palpitations   . HSV (herpes simplex virus) anogenital infection   . Urinary tract infection   . Vaginal Pap smear, abnormal    Leep Jan 2019    Past Surgical History:  Procedure Laterality Date  . arm surgery Right    . CESAREAN SECTION  04/16/2011   Procedure: CESAREAN SECTION;  Surgeon: Roseanna Rainbow, MD;  Location: [facility];  Service: Gynecology;  Laterality: N/A;  . LEEP    . WISDOM TOOTH EXTRACTION      Current Outpatient Medications on File Prior to Visit  Medication Sig Dispense Refill  . cholecalciferol (VITAMIN D) 1000 units tablet Take 1 tablet (1,000 Units total) by mouth daily. (Patient not taking: Reported on 12/24/2017) 30 tablet 6  . clindamycin (CLEOCIN) 150 MG capsule Take 2 capsules (300 mg total) by mouth 4 (four) times daily. May dispense as 150mg  capsules (Patient not taking: Reported on 06/24/2017) 56 capsule 0  . ibuprofen (ADVIL,MOTRIN) 600 MG tablet Take 1 tablet (600 mg total) by mouth every 6 (six) hours as needed. (Patient not taking: Reported on 08/10/2017) 30 tablet 0  . valACYclovir (VALTREX) 500 MG tablet Take 1 tablet (500 mg total) by mouth daily. Can increase to twice a day for 5 days in the event of a recurrence (Patient not taking: Reported on 06/24/2017) 30 tablet 12   No current facility-administered medications on file prior to visit.     Allergies  Allergen Reactions  . Amoxicillin Hives    Has patient had a PCN reaction causing  immediate rash, facial/tongue/throat swelling, SOB or lightheadedness with hypotension: Yes Has patient had a PCN reaction causing severe rash involving mucus membranes or skin necrosis: No Has patient had a PCN reaction that required hospitalization: No Has patient had a PCN reaction occurring within the last 10 years: No If all of the above answers are "NO", then may proceed with Cephalosporin use.   Marland Kitchen Penicillins Hives    Has patient had a PCN reaction causing immediate rash, facial/tongue/throat swelling, SOB or lightheadedness with hypotension: Yes Has patient had a PCN reaction causing severe rash involving mucus membranes or skin necrosis: No Has patient had a PCN reaction that required hospitalization: No Has patient had a  PCN reaction occurring within the last 10 years: No If all of the above answers are "NO", then may proceed with Cephalosporin use.     Social History:  reports that she quit smoking about 4 years ago. Her smoking use included cigars. She has never used smokeless tobacco. She reports that she does not drink alcohol or use drugs.  Family History  Problem Relation Age of Onset  . Hypertension Mother   . Cancer Father        thyroid  . Diabetes Father   . Hypertension Maternal Grandmother   . Arthritis Maternal Grandmother   . Heart disease Paternal Grandmother   . Arthritis Paternal Grandmother   . Anesthesia problems Neg Hx   . Hypotension Neg Hx   . Malignant hyperthermia Neg Hx   . Pseudochol deficiency Neg Hx     The following portions of the patient's history were reviewed and updated as appropriate: allergies, current medications, past family history, past medical history, past social history, past surgical history and problem list.  Review of Systems Pertinent items noted in HPI and remainder of comprehensive ROS otherwise negative.   Objective:  BP 115/75   Pulse 90   Wt 226 lb (102.5 kg)   LMP 12/06/2017   BMI 41.34 kg/m  CONSTITUTIONAL: Well-developed, obese female in no acute distress.  NECK: Normal range of motion, supple, no masses.  Normal thyroid.  SKIN: Skin is warm and dry. No rash noted. Not diaphoretic. No erythema. No pallor. NEUROLOGIC: Alert and oriented to person, place, and time. Normal reflexes, muscle tone coordination. No cranial nerve deficit noted. PSYCHIATRIC: Normal mood and affect. Normal behavior. Normal judgment and thought content. CARDIOVASCULAR: Normal heart rate noted, regular rhythm RESPIRATORY: Clear to auscultation bilaterally. Effort and breath sounds normal, no problems with respiration noted. ABDOMEN: Soft, normal bowel sounds, no distention noted.  No tenderness, rebound or guarding.  PELVIC: Normal appearing external genitalia; No  abnormal discharge noted.  Vaginal swabs obtained.  Normal uterine size, no other palpable masses, no uterine or adnexal tenderness. No palpable cyst.     Assessment and Plan:  1. Lower abdominal pain - Dysmenorrhea vs Ovarian cyst  -POCT urine pregnancy - Cervicovaginal ancillary only - POCT Urinalysis Dipstick - HIV antibody - Hepatitis C Antibody - Hepatitis B Surface AntiGEN - RPR - US PELVIC COMPLETE WITH TRANSVAGINAL; Future- scheduled for 12/31/17  2. Acute cystitis without hematuria - Will treat for UTI based on appearance, smell and clinical symptoms   - sulfamethoxazole-trimethoprim (BACTRIM DS,SEPTRA DS) 800-160 MG tablet; Take 1 tablet by mouth 2 (two) times daily.  Dispense: 6 tablet; Refill: 0   Will follow up results of lab results and manage accordingly. Follow up in 2-3 months  [place] to be scheduled to check for abnormalities of uterus or ovaries.  Please refer to After Visit Summary for other counseling recommendations.    Sharyon CableVeronica C Exodus Kutzer, Certified Nurse Midwife

## 2017-12-24 NOTE — Progress Notes (Signed)
Presents for lower abdominal cramping pain 7-8/10 x 1+ month, nausea, cold sweats.  Denies discharge, odor.

## 2017-12-24 NOTE — Patient Instructions (Signed)

## 2017-12-25 LAB — HEPATITIS B SURFACE ANTIGEN: Hepatitis B Surface Ag: NEGATIVE

## 2017-12-25 LAB — RPR: RPR Ser Ql: NONREACTIVE

## 2017-12-25 LAB — HEPATITIS C ANTIBODY: Hep C Virus Ab: <0.1 s/co ratio (ref 0.0–0.9)

## 2017-12-25 LAB — HIV ANTIBODY (ROUTINE TESTING W REFLEX): HIV Screen 4th Generation wRfx: NONREACTIVE

## 2017-12-28 LAB — CERVICOVAGINAL ANCILLARY ONLY
Chlamydia: NEGATIVE
Neisseria Gonorrhea: NEGATIVE

## 2017-12-31 ENCOUNTER — Ambulatory Visit (HOSPITAL_COMMUNITY)
Admission: RE | Admit: 2017-12-31 | Discharge: 2017-12-31 | Disposition: A | Discharge status: Home / Self Care | Payer: Medicaid Other | Source: Ambulatory Visit | Attending: Certified Nurse Midwife | Admitting: Certified Nurse Midwife

## 2017-12-31 DIAGNOSIS — R1030 Lower abdominal pain, unspecified: Secondary | ICD-10-CM | POA: Diagnosis present

## 2018-01-03 ENCOUNTER — Telehealth: Payer: Self-pay

## 2018-01-03 ENCOUNTER — Other Ambulatory Visit: Payer: Self-pay

## 2018-01-03 DIAGNOSIS — N3000 Acute cystitis without hematuria: Secondary | ICD-10-CM

## 2018-01-03 NOTE — Telephone Encounter (Signed)
TC from pt requesting U/S results from Friday 12/31/17.  Please review and advise.  Pt made aware once reviewed we will let her know results.   Pt voiced understanding.

## 2018-01-06 NOTE — Telephone Encounter (Signed)
Is this ok to refill? Please advise. 

## 2018-01-07 MED ORDER — SULFAMETHOXAZOLE-TRIMETHOPRIM 800-160 MG PO TABS: 1.0000 | ORAL_TABLET | Freq: Two times a day (BID) | ORAL | 0 refills | Status: DC

## 2019-01-09 ENCOUNTER — Other Ambulatory Visit: Payer: Self-pay | Admitting: Obstetrics & Gynecology

## 2019-01-09 DIAGNOSIS — A609 Anogenital herpesviral infection, unspecified: Secondary | ICD-10-CM

## 2019-02-02 ENCOUNTER — Ambulatory Visit: Payer: Medicaid Other

## 2019-02-06 ENCOUNTER — Ambulatory Visit: Payer: Medicaid Other

## 2019-02-06 ENCOUNTER — Other Ambulatory Visit: Payer: Self-pay

## 2019-02-06 ENCOUNTER — Other Ambulatory Visit (HOSPITAL_COMMUNITY)
Admission: RE | Admit: 2019-02-06 | Discharge: 2019-02-06 | Disposition: A | Discharge status: Home / Self Care | Payer: Medicaid Other | Source: Ambulatory Visit | Attending: Obstetrics and Gynecology | Admitting: Obstetrics and Gynecology

## 2019-02-06 DIAGNOSIS — N898 Other specified noninflammatory disorders of vagina: Secondary | ICD-10-CM | POA: Insufficient documentation

## 2019-02-06 NOTE — Progress Notes (Signed)
Patient is in the office for std testing, reports new partner.Patient reports vaginal itching and slight cramping, pt requests urine culture also.  Pt states that she wants a blood test today to see if she's pregnant, LMP 01-14-19, advised pt that she should wait to see if she misses her cycle since she has not yet, and then take home UPT or come back to office, pt agreed.

## 2019-02-07 LAB — HEPATITIS B SURFACE ANTIGEN: Hepatitis B Surface Ag: NEGATIVE

## 2019-02-07 LAB — HIV ANTIBODY (ROUTINE TESTING W REFLEX): HIV Screen 4th Generation wRfx: NONREACTIVE

## 2019-02-07 LAB — RPR: RPR Ser Ql: NONREACTIVE

## 2019-02-07 LAB — HEPATITIS C ANTIBODY: Hep C Virus Ab: <0.1 s/co ratio (ref 0.0–0.9)

## 2019-02-07 NOTE — Progress Notes (Signed)
I have reviewed this chart and agree with the RN/CMA assessment and management.    K. Meryl Davis, M.D. Attending [facility] (Faculty Practice)   

## 2019-02-08 LAB — CERVICOVAGINAL ANCILLARY ONLY
Bacterial vaginitis: POSITIVE — AB
Candida vaginitis: NEGATIVE
Chlamydia: NEGATIVE
Neisseria Gonorrhea: NEGATIVE
Trichomonas: NEGATIVE

## 2019-02-08 LAB — URINE CULTURE

## 2019-02-10 ENCOUNTER — Other Ambulatory Visit: Payer: Self-pay | Admitting: Obstetrics and Gynecology

## 2019-02-10 ENCOUNTER — Other Ambulatory Visit: Payer: Self-pay | Admitting: Obstetrics

## 2019-02-10 DIAGNOSIS — B9689 Other specified bacterial agents as the cause of diseases classified elsewhere: Secondary | ICD-10-CM

## 2019-02-10 DIAGNOSIS — N760 Acute vaginitis: Secondary | ICD-10-CM

## 2019-02-10 MED ORDER — METRONIDAZOLE 500 MG PO TABS: 500.0000 mg | ORAL_TABLET | Freq: Two times a day (BID) | ORAL | 2 refills | Status: DC

## 2019-02-17 ENCOUNTER — Telehealth: Payer: Self-pay | Admitting: Obstetrics and Gynecology

## 2019-02-20 ENCOUNTER — Other Ambulatory Visit: Payer: Self-pay

## 2019-02-20 ENCOUNTER — Encounter: Payer: Self-pay | Admitting: Obstetrics and Gynecology

## 2019-02-20 ENCOUNTER — Ambulatory Visit: Payer: Medicaid Other | Admitting: Obstetrics and Gynecology

## 2019-02-20 DIAGNOSIS — Z309 Encounter for contraceptive management, unspecified: Secondary | ICD-10-CM | POA: Insufficient documentation

## 2019-02-20 NOTE — Progress Notes (Signed)
Last Annual :12/10/2016 Pt wants to discuss Birth Control options considering Nexplanon. CC: None  Pt states she has had unprotected intercourse in the last 14 days.    STD Screening:Declines  Last pap: 12/10/2016 Abnormal  Colpo: 12/2016  Leep : 06/24/2017   Once pt was roomed she did not want to undress and only wanted nexplanon insertion. Pt advised she had to have a annual in order to receive contraception.  Pt states she had a Annual at a different office. I consulted w/ Dr.Ervin pt needs to sign ROI and return in 2 wks for Nexplanon insertion after Negative UPT. Pt then stated it had been 15 days since unprotected intercourse . Pt made aware no contraception would be given today and she needs to come back in 2 wks for birth control.  Pt declined and stated she will seek care at a different office.     As noted above. Pt not seen.

## 2019-12-12 ENCOUNTER — Emergency Department (HOSPITAL_COMMUNITY)
Admission: EM | Admit: 2019-12-12 | Discharge: 2019-12-12 | Disposition: A | Discharge status: Home / Self Care | Payer: Medicaid Other | Attending: Emergency Medicine | Admitting: Emergency Medicine

## 2019-12-12 ENCOUNTER — Emergency Department (HOSPITAL_COMMUNITY): Payer: Medicaid Other

## 2019-12-12 ENCOUNTER — Other Ambulatory Visit: Payer: Self-pay

## 2019-12-12 ENCOUNTER — Encounter (HOSPITAL_COMMUNITY): Payer: Self-pay

## 2019-12-12 DIAGNOSIS — R6883 Chills (without fever): Secondary | ICD-10-CM | POA: Insufficient documentation

## 2019-12-12 DIAGNOSIS — L03313 Cellulitis of chest wall: Secondary | ICD-10-CM | POA: Diagnosis not present

## 2019-12-12 DIAGNOSIS — L02419 Cutaneous abscess of limb, unspecified: Secondary | ICD-10-CM

## 2019-12-12 DIAGNOSIS — L02411 Cutaneous abscess of right axilla: Secondary | ICD-10-CM | POA: Insufficient documentation

## 2019-12-12 DIAGNOSIS — J45909 Unspecified asthma, uncomplicated: Secondary | ICD-10-CM | POA: Diagnosis not present

## 2019-12-12 DIAGNOSIS — G8929 Other chronic pain: Secondary | ICD-10-CM | POA: Diagnosis not present

## 2019-12-12 DIAGNOSIS — Z87891 Personal history of nicotine dependence: Secondary | ICD-10-CM | POA: Insufficient documentation

## 2019-12-12 DIAGNOSIS — M25561 Pain in right knee: Secondary | ICD-10-CM | POA: Diagnosis not present

## 2019-12-12 LAB — PREGNANCY, URINE: Preg Test, Ur: NEGATIVE

## 2019-12-12 MED ORDER — LIDOCAINE-EPINEPHRINE 1 %-1:100000 IJ SOLN
10.0000 mL | Freq: Once | INTRAMUSCULAR | Status: AC
  Administered 2019-12-12: 10 mL
  Filled 2019-12-12: qty 1

## 2019-12-12 MED ORDER — NAPROXEN 500 MG PO TABS
500.0000 mg | ORAL_TABLET | Freq: Two times a day (BID) | ORAL | 0 refills | Status: DC
Start: 2019-12-12 — End: 2022-05-08

## 2019-12-12 MED ORDER — SULFAMETHOXAZOLE-TRIMETHOPRIM 800-160 MG PO TABS: 1.0000 | ORAL_TABLET | Freq: Two times a day (BID) | ORAL | 0 refills | Status: AC

## 2019-12-12 MED ORDER — HYDROCODONE-ACETAMINOPHEN 5-325 MG PO TABS
1.0000 | ORAL_TABLET | Freq: Once | ORAL | Status: AC
  Administered 2019-12-12: 1 via ORAL
  Filled 2019-12-12: qty 1

## 2019-12-12 MED ORDER — SULFAMETHOXAZOLE-TRIMETHOPRIM 800-160 MG PO TABS
1.0000 | ORAL_TABLET | Freq: Once | ORAL | Status: AC
  Administered 2019-12-12: 1 via ORAL
  Filled 2019-12-12: qty 1

## 2019-12-12 NOTE — ED Triage Notes (Signed)
Pt reports abscess under her right arm for the past week, states it is golf ball sized. No drainage noted. Pt also reports right knee swelling for the past few months. No recent injuries or fall.

## 2019-12-12 NOTE — ED Provider Notes (Signed)
[facility]   CSN: [id number] Arrival date & time: 12/12/19  [id number]     History Chief Complaint  Patient presents with  . Abscess  . Knee Pain    Krista Meza is a 29 y.o. female.  Patient presents emergency department with complaint of swelling and pain in the right axilla area, gradually worsening over the past week.  Patient has had similar areas that are spontaneously resolved in the past.  She has not needed incision and drainage before.  She has had some chills but no fevers.  She reports a lightheaded episode after getting out of a bath of the day when she was soaking the area in warm water.  The soaked helped temporarily.  No full syncope.  Patient also complains of pain in her right knee which has been ongoing for several months.  No overlying redness.  Pain is worse with certain positions and with driving.  She denies strenuous activities on her knee.  She has applied ice.  No previous surgeries or use of anti-inflammatories.  No calf or thigh swelling reported.        Past Medical History:  Diagnosis Date  . Anxiety   . Asthma    as child, inhaler for a year  . Condyloma acuminata 10/14/2013   Vaginal white plaque approx 2.5 cm at base of introitus, smooth irregular borders. No pruritis. Biopsy postpartum.   . Depression    doing ok  . Headache   . Heart murmur    palpitations  . Heart palpitations   . HSV (herpes simplex virus) anogenital infection   . Urinary tract infection   . Vaginal Pap smear, abnormal    Leep Jan 2019    Patient Active Problem List   Diagnosis Date Noted  . Contraception management 02/20/2019  . CIN III (cervical intraepithelial neoplasia III) 08/10/2017  . 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 HSV (herpes simplex virus) anogenital infection   . Condyloma acuminata 10/14/2013  . Obesity (BMI 30-39.9) 09/17/2013  . Avitaminosis D 08/03/2011  . Chronic depression  07/31/2011  . Arthralgia of multiple joints 07/31/2011  . Intermittent pain 07/31/2011    Past Surgical History:  Procedure Laterality Date  . arm surgery Right   . CESAREAN SECTION  04/16/2011   Procedure: CESAREAN SECTION;  Surgeon: Roseanna RainbowLisa A Jackson-Moore, MD;  Location: [facility] ORS;  Service: Gynecology;  Laterality: N/A;  . LEEP    . WISDOM TOOTH EXTRACTION       OB History    Gravida  2   Para  2   Term  2   Preterm      AB      Living  2     SAB      TAB      Ectopic      Multiple      Live Births  2           Family History  Problem Relation Age of Onset  . Hypertension Mother   . Cancer Father        thyroid  . Diabetes Father   . Hypertension Maternal Grandmother   . Arthritis Maternal Grandmother   . Heart disease Paternal Grandmother   . Arthritis Paternal Grandmother   . Anesthesia problems Neg Hx   . Hypotension Neg Hx   . Malignant hyperthermia Neg Hx   . Pseudochol deficiency Neg Hx     Social History  Tobacco Use  . Smoking status: Former Smoker    Types: Cigars    Quit date: 04/08/2013    Years since quitting: 6.6  . Smokeless tobacco: Never Used  Vaping Use  . Vaping Use: Never used  Substance Use Topics  . Alcohol use: No  . Drug use: No    Home Medications Prior to Admission medications   Medication Sig Start Date End Date Taking? Authorizing Provider  cholecalciferol (VITAMIN D) 1000 units tablet Take 1 tablet (1,000 Units total) by mouth daily. Patient not taking: Reported on 12/24/2017 09/22/17   Adam Phenix, MD  clindamycin (CLEOCIN) 150 MG capsule Take 2 capsules (300 mg total) by mouth 4 (four) times daily. May dispense as 150mg  capsules Patient not taking: Reported on 06/24/2017 03/19/17   03/21/17, PA-C  ibuprofen (ADVIL,MOTRIN) 600 MG tablet Take 1 tablet (600 mg total) by mouth every 6 (six) hours as needed. Patient not taking: Reported on 08/10/2017 07/14/17   07/16/17, PA-C  metroNIDAZOLE (FLAGYL) 500  MG tablet Take 1 tablet (500 mg total) by mouth 2 (two) times daily. Patient not taking: Reported on 02/20/2019 02/10/19   02/12/19, MD  sulfamethoxazole-trimethoprim (BACTRIM DS,SEPTRA DS) 800-160 MG tablet Take 1 tablet by mouth 2 (two) times daily. Patient not taking: Reported on 02/06/2019 01/07/18   01/09/18, MD  valACYclovir (VALTREX) 500 MG tablet TAKE 1 TABLET BY MOUTH DAILY, CAN INCREASE TO TWICE DAILY FOR 5 DAYS IN THE EVEN OF A RECURRENCE Patient not taking: Reported on 02/06/2019 01/09/19   01/11/19, MD    Allergies    Amoxicillin and Penicillins  Review of Systems   Review of Systems  Constitutional: Positive for chills. Negative for activity change and fever.  Gastrointestinal: Negative for nausea and vomiting.  Musculoskeletal: Positive for arthralgias. Negative for gait problem, joint swelling and neck pain.  Skin: Positive for color change. Negative for wound.       Positive for abscess.  Neurological: Negative for weakness and numbness.  Hematological: Negative for adenopathy.    Physical Exam Updated Vital Signs BP 131/68 (BP Location: Left Arm)   Pulse 96   Temp 98.6 F (37 C) (Oral)   Resp 18   Ht 5\' 2"  (1.575 m)   Wt 72.6 kg   LMP 12/04/2019   SpO2 100%   BMI 29.26 kg/m   Physical Exam Vitals and nursing note reviewed.  Constitutional:      Appearance: She is well-developed.  HENT:     Head: Normocephalic and atraumatic.  Eyes:     Conjunctiva/sclera: Conjunctivae normal.  Pulmonary:     Effort: No respiratory distress.  Musculoskeletal:     Cervical back: Normal range of motion and neck supple.     Comments: Patient with trace effusion suspected in right knee.  Normal range of motion actively.  No overlying erythema or signs of septic arthritis.  No point tenderness.  Skin:    General: Skin is warm and dry.     Comments: Patient with 1 cm area of erythema and approximately 4 cm x 4 cm area of induration in the right axilla  consistent with abscess.  She has associated cellulitis which spreads anteriorly onto the lateral aspect of the right breast.  No active drainage.  Area is very tender to palpation.  Neurological:     Mental Status: She is alert.     ED Results / Procedures / Treatments   Labs (all labs ordered  are listed, but only abnormal results are displayed) Labs Reviewed  PREGNANCY, URINE    EKG None  Radiology DG Knee Complete 4 Views Right  Result Date: 12/12/2019 CLINICAL DATA:  Right knee swelling for months EXAM: RIGHT KNEE - COMPLETE 4+ VIEW COMPARISON:  None. FINDINGS: No acute fracture or dislocation. Joint spaces and alignment are maintained. No area of erosion or osseous destruction. No unexpected radiopaque foreign body. Soft tissues are unremarkable. IMPRESSION: Negative. Electronically Signed   By: Meda Klinefelter MD   On: 12/12/2019 08:41    Procedures .Marland KitchenIncision and Drainage  Date/Time: 12/12/2019 2:23 PM Performed by: Renne Crigler, PA-C Authorized by: Renne Crigler, PA-C   Consent:    Consent obtained:  Verbal   Consent given by:  Patient   Risks discussed:  Pain, infection and bleeding   Alternatives discussed:  No treatment and alternative treatment Location:    Type:  Abscess   Size:  4cm   Location:  Upper extremity   Upper extremity location: R axilla. Pre-procedure details:    Skin preparation:  Betadine Anesthesia (see MAR for exact dosages):    Anesthesia method:  Local infiltration   Local anesthetic:  Lidocaine 2% WITH epi Procedure type:    Complexity:  Simple Procedure details:    Incision types:  Single straight   Incision depth:  Dermal   Scalpel blade:  11   Wound management:  Probed and deloculated   Drainage:  Purulent   Drainage amount:  Moderate   Wound treatment:  Wound left open   Packing materials:  None Post-procedure details:    Patient tolerance of procedure:  Tolerated well, no immediate complications   (including critical  care time)  Medications Ordered in ED Medications  lidocaine-EPINEPHrine (XYLOCAINE W/EPI) 1 %-1:100000 (with pres) injection 10 mL (10 mLs Infiltration Given 12/12/19 1332)  sulfamethoxazole-trimethoprim (BACTRIM DS) 800-160 MG per tablet 1 tablet (1 tablet Oral Given 12/12/19 1418)  HYDROcodone-acetaminophen (NORCO/VICODIN) 5-325 MG per tablet 1 tablet (1 tablet Oral Given 12/12/19 1418)    ED Course  I have reviewed the triage vital signs and the nursing notes.  Pertinent labs & imaging results that were available during my care of the patient were reviewed by me and considered in my medical decision making (see chart for details).  Patient seen and examined.  Discussed I&D procedure with patient and she agrees to proceed.  Regarding her knee, discussed x-ray.  Discussed trial of anti-inflammatories and PCP follow-up for this.  Vital signs reviewed and are as follows: BP 131/68 (BP Location: Left Arm)   Pulse 96   Temp 98.6 F (37 C) (Oral)   Resp 18   Ht 5\' 2"  (1.575 m)   Wt 72.6 kg   LMP 12/04/2019   SpO2 100%   BMI 29.26 kg/m   The patient was urged to return to the Emergency Department urgently with worsening pain, swelling, expanding erythema especially if it streaks away from the affected area, fever, or if they have any other concerns.   The patient was urged to return to the Emergency Department or go to their PCP in 48 hours for wound recheck if the area is not significantly improved.  The patient verbalized understanding and stated agreement with this plan.     MDM Rules/Calculators/A&P                          Abscess/cellulitis: I&D performed without complication. Bactrim for suppurative infection.   Knee  pain: chronic, no signs of infection.     Final Clinical Impression(s) / ED Diagnoses Final diagnoses:  Axillary abscess  Chronic pain of right knee  Cellulitis of chest wall    Rx / DC Orders ED Discharge Orders         Ordered     sulfamethoxazole-trimethoprim (BACTRIM DS) 800-160 MG tablet  2 times daily     Discontinue  Reprint     12/12/19 1422    naproxen (NAPROSYN) 500 MG tablet  2 times daily     Discontinue  Reprint     12/12/19 1422           Renne Crigler, PA-C 12/12/19 1751    Derwood Kaplan, MD 12/20/19 [phone]

## 2019-12-12 NOTE — Discharge Instructions (Signed)
Please read and follow all provided instructions.  Your diagnoses today include:  1. Axillary abscess   2. Chronic pain of right knee   3. Cellulitis of chest wall     Tests performed today include:  Vital signs. See below for your results today.   Medications prescribed:   Bactrim (trimethoprim/sulfamethoxazole) - antibiotic  You have been prescribed an antibiotic medicine: take the entire course of medicine even if you are feeling better. Stopping early can cause the antibiotic not to work.   Naproxen - anti-inflammatory pain medication  Do not exceed 500mg  naproxen every 12 hours, take with food  You have been prescribed an anti-inflammatory medication or NSAID. Take with food. Take smallest effective dose for the shortest duration needed for your pain. Stop taking if you experience stomach pain or vomiting.   Take any prescribed medications only as directed.   Home care instructions:   Follow any educational materials contained in this packet  Follow-up instructions: Return to the Emergency Department in 48 hours for a recheck if your symptoms are not significantly improved.  Please follow-up with your primary care provider in the next 1 week for further evaluation of your symptoms.   Return instructions:  Return to the Emergency Department if you have:  Fever  Worsening symptoms  Worsening pain  Worsening swelling  Redness of the skin that moves away from the affected area, especially if it streaks away from the affected area   Any other emergent concerns  Your vital signs today were: BP 131/68 (BP Location: Left Arm)   Pulse 96   Temp 98.6 F (37 C) (Oral)   Resp 18   Ht 5\' 2"  (1.575 m)   Wt 72.6 kg   LMP 12/04/2019   SpO2 100%   BMI 29.26 kg/m  If your blood pressure (BP) was elevated above 135/85 this visit, please have this repeated by your doctor within one month. --------------

## 2020-09-22 HISTORY — PX: KNEE SURGERY: SHX244

## 2020-09-22 HISTORY — PX: SUBMANDIBULAR GLAND EXCISION: SHX2456

## 2021-06-09 ENCOUNTER — Ambulatory Visit
Admission: EM | Admit: 2021-06-09 | Discharge: 2021-06-09 | Disposition: A | Discharge status: Home / Self Care | Payer: Medicaid Other | Attending: Emergency Medicine | Admitting: Emergency Medicine

## 2021-06-09 ENCOUNTER — Other Ambulatory Visit: Payer: Self-pay

## 2021-06-09 DIAGNOSIS — B349 Viral infection, unspecified: Secondary | ICD-10-CM | POA: Diagnosis not present

## 2021-06-09 DIAGNOSIS — J111 Influenza due to unidentified influenza virus with other respiratory manifestations: Secondary | ICD-10-CM

## 2021-06-09 MED ORDER — OSELTAMIVIR PHOSPHATE 75 MG PO CAPS: 75.0000 mg | ORAL_CAPSULE | Freq: Two times a day (BID) | ORAL | 0 refills | Status: AC

## 2021-06-09 NOTE — ED Provider Notes (Signed)
[facility]    CSN: [id number] Arrival date & time: 06/09/21  [id number]    HISTORY   Chief Complaint  Patient presents with   Fever   Cough   HPI Krista Meza is a 31 y.o. female. Pt presents with fever, chills, sore throat that began yesterday, patient states her throat is more sore today and she also has body aches from "head to toe".  Patient states she had a headache last night and had to leave work.  Patient does have a temperature 100.1 with a heart rate of 103.  Patient denies known sick contacts.  Patient denies nausea, vomiting, diarrhea, anosmia.   Past Medical History:  Diagnosis Date   Anxiety    Asthma    as child, inhaler for a year   Condyloma acuminata 10/14/2013   Vaginal white plaque approx 2.5 cm at base of introitus, smooth irregular borders. No pruritis. Biopsy postpartum.    Depression    doing ok   Headache    Heart murmur    palpitations   Heart palpitations    HSV (herpes simplex virus) anogenital infection    Urinary tract infection    Vaginal Pap smear, abnormal    Leep Jan 2019   Patient Active Problem List   Diagnosis Date Noted   Contraception management 02/20/2019   CIN III (cervical intraepithelial neoplasia III) 08/10/2017   LGSIL on Pap smear of cervix 01/06/2017   HSV (herpes simplex virus) anogenital infection    Condyloma acuminata 10/14/2013   Obesity (BMI 30-39.9) 09/17/2013   Avitaminosis D 08/03/2011   Chronic depression 07/31/2011   Arthralgia of multiple joints 07/31/2011   Intermittent pain 07/31/2011   Past Surgical History:  Procedure Laterality Date   arm surgery Right    CESAREAN SECTION  04/16/2011   Procedure: CESAREAN SECTION;  Surgeon: Roseanna RainbowLisa A Jackson-Moore, MD;  Location: [facility];  Service: Gynecology;  Laterality: N/A;   LEEP     WISDOM TOOTH EXTRACTION     OB History     Gravida  2   Para  2   Term  2   Preterm      AB      Living  2      SAB      IAB      Ectopic       Multiple      Live Births  2          Home Medications    Prior to Admission medications   Medication Sig Start Date End Date Taking? Authorizing Provider  naproxen (NAPROSYN) 500 MG tablet Take 1 tablet (500 mg total) by mouth 2 (two) times daily. 12/12/19   Renne CriglerGeiple, Joshua, PA-C  PRESCRIPTION MEDICATION Nexaplon    [provider]   Family History Family History  Problem Relation Age of Onset   Hypertension Mother    Cancer Father        thyroid   Diabetes Father    Hypertension Maternal Grandmother    Arthritis Maternal Grandmother    Heart disease Paternal Grandmother    Arthritis Paternal Grandmother    Anesthesia problems Neg Hx    Hypotension Neg Hx    Malignant hyperthermia Neg Hx    Pseudochol deficiency Neg Hx    Social History Social History   Tobacco Use   Smoking status: Former    Types: Cigars    Quit date: 04/08/2013    Years since quitting: 8.1   Smokeless  tobacco: Never  Vaping Use   Vaping Use: Never used  Substance Use Topics   Alcohol use: No   Drug use: No   Allergies   Amoxicillin and Penicillins  Review of Systems Review of Systems Pertinent findings noted in history of present illness.   Physical Exam Triage Vital Signs ED Triage Vitals  Enc Vitals Group     BP 03/21/21 0827 (!) 147/82     Pulse Rate 03/21/21 0827 72     Resp 03/21/21 0827 18     Temp 03/21/21 0827 98.3 F (36.8 C)     Temp Source 03/21/21 0827 Oral     SpO2 03/21/21 0827 98 %     Weight --      Height --      Head Circumference --      Peak Flow --      Pain Score 03/21/21 0826 5     Pain Loc --      Pain Edu? --      Excl. in GC? --   No data found.  Updated Vital Signs BP 117/76 (BP Location: Left Arm)    Pulse (!) 103    Temp 100.1 F (37.8 C) (Oral)    Resp 16    LMP 05/30/2021 (Approximate)    SpO2 100%   Physical Exam Constitutional:      Appearance: She is ill-appearing.  HENT:     Head: Normocephalic and atraumatic.     Salivary  Glands: Right salivary gland is not diffusely enlarged or tender. Left salivary gland is not diffusely enlarged or tender.     Right Ear: Tympanic membrane, ear canal and external ear normal.     Left Ear: Tympanic membrane, ear canal and external ear normal.     Nose: Congestion and rhinorrhea present. Rhinorrhea is clear.     Right Sinus: No maxillary sinus tenderness or frontal sinus tenderness.     Left Sinus: No maxillary sinus tenderness.     Mouth/Throat:     Mouth: Mucous membranes are moist.     Pharynx: Pharyngeal swelling, posterior oropharyngeal erythema and uvula swelling present.     Tonsils: No tonsillar exudate. 0 on the right. 0 on the left.  Cardiovascular:     Rate and Rhythm: Normal rate and regular rhythm.     Pulses: Normal pulses.  Pulmonary:     Effort: Pulmonary effort is normal. No accessory muscle usage, prolonged expiration or respiratory distress.     Breath sounds: No stridor. No wheezing, rhonchi or rales.     Comments: Turbulent breath sounds throughout without wheeze, rale, rhonchi. Abdominal:     General: Abdomen is flat. Bowel sounds are normal.     Palpations: Abdomen is soft.  Musculoskeletal:        General: Normal range of motion.  Lymphadenopathy:     Cervical: Cervical adenopathy present.     Right cervical: Superficial cervical adenopathy and posterior cervical adenopathy present.     Left cervical: Superficial cervical adenopathy and posterior cervical adenopathy present.  Skin:    General: Skin is warm and dry.  Neurological:     General: No focal deficit present.     Mental Status: She is alert and oriented to person, place, and time.     Motor: Motor function is intact.     Coordination: Coordination is intact.     Gait: Gait is intact.     Deep Tendon Reflexes: Reflexes are normal and symmetric.  Psychiatric:  Attention and Perception: Attention and perception normal.        Mood and Affect: Mood and affect normal.         Speech: Speech normal.        Behavior: Behavior normal. Behavior is cooperative.        Thought Content: Thought content normal.    Visual Acuity Right Eye Distance:   Left Eye Distance:   Bilateral Distance:    Right Eye Near:   Left Eye Near:    Bilateral Near:     UC Couse / Diagnostics / Procedures:    EKG  Radiology No results found.  Procedures Procedures (including critical care time)  UC Diagnoses / Final Clinical Impressions(s)   I have reviewed the triage vital signs and the nursing notes.  Pertinent labs & imaging results that were available during my care of the patient were reviewed by me and considered in my medical decision making (see chart for details).   Final diagnoses:  Viral illness  Influenza-like illness   Viral illness, likely influenza.  Patient COVID flu tested, will notify of results once received.  Tamiflu provided empirically based on physical exam findings.  Note provided for work.  Return precautions advised.  ED Prescriptions     Medication Sig Dispense Auth. Provider   oseltamivir (TAMIFLU) 75 MG capsule Take 1 capsule (75 mg total) by mouth every 12 (twelve) hours for 5 days. 10 capsule Theadora Rama Scales, PA-C      PDMP not reviewed this encounter.  Pending results:  Labs Reviewed  COVID-19, FLU A+B NAA    Medications Ordered in UC: Medications - No data to display  Disposition Upon Discharge:  Condition: stable for discharge home Home: take medications as prescribed; routine discharge instructions as discussed; follow up as advised.  Patient presented with an acute illness with associated systemic symptoms and significant discomfort requiring urgent management. In my opinion, this is a condition that a prudent lay person (someone who possesses an average knowledge of health and medicine) may potentially expect to result in complications if not addressed urgently such as respiratory distress, impairment of bodily function  or dysfunction of bodily organs.   Routine symptom specific, illness specific and/or disease specific instructions were discussed with the patient and/or caregiver at length.   As such, the patient has been evaluated and assessed, work-up was performed and treatment was provided in alignment with urgent care protocols and evidence based medicine.  Patient/parent/caregiver has been advised that the patient may require follow up for further testing and treatment if the symptoms continue in spite of treatment, as clinically indicated and appropriate.  If the patient was tested for COVID-19, Influenza and/or RSV, then the patient/parent/guardian was advised to isolate at home pending the results of his/her diagnostic coronavirus test and potentially longer if theyre positive. I have also advised pt that if his/her COVID-19 test returns positive, it's recommended to self-isolate for at least 10 days after symptoms first appeared AND until fever-free for 24 hours without fever reducer AND other symptoms have improved or resolved. Discussed self-isolation recommendations as well as instructions for household member/close contacts as per the [facility] and  [facility], and also gave patient the COVID packet with this information.  Patient/parent/caregiver has been advised to return to the [facility] or PCP in 3-5 days if no better; to PCP or the Emergency Department if new signs and symptoms develop, or if the current signs or symptoms continue to change or worsen for further workup, evaluation  and treatment as clinically indicated and appropriate  The patient will follow up with their current PCP if and as advised. If the patient does not currently have a PCP we will assist them in obtaining one.   The patient may need specialty follow up if the symptoms continue, in spite of conservative treatment and management, for further workup, evaluation, consultation and treatment as clinically indicated and  appropriate.  Patient/parent/caregiver verbalized understanding and agreement of plan as discussed.  All questions were addressed during visit.  Please see discharge instructions below for further details of plan.  Discharge Instructions:   Discharge Instructions      Your symptoms and physical exam findings are concerning for a viral respiratory infection.   You were tested for both COVID and influenza today because here in the urgent care setting, we do not have an available option for an individual influenza test.  I apologize for the redundancy if you have already taken a COVID test at home.  The result of your viral testing will be posted to your MyChart once it is complete, this typically takes 24 to 48 hours.  If there is a positive result, you will be contacted by phone with further recommendations, if any.     As you may already know, we are unable to perform rapid testing for influenza due to a national shortage of rapid influenza tests.   I recommend that you begin taking Tamiflu now for empiric treatment of presumed influenza based on the history provided to me today along with my physical exam findings. Tamiflu is an antiviral medication that decreases the severity, duration and transmissibility of influenza virus by preventing the virus from reproducing itself in your body.     If the influenza result is positive, please continue the full 5-day course of Tamiflu.  If the result is negative, please feel free to discontinue Tamiflu if you prefer but do keep in mind that Tamiflu can also be taken preventatively.  Finishing the full 5-day course will decrease the chances of catching influenza from anyone else and will not cause harm otherwise.    Conservative care is also recommended at this time.  This includes rest, pushing clear fluids and activity as tolerated.  Warm beverages such as teas and broths versus cold beverages/popsicles and frozen sherbet/sorbet are your choice, both  warm and cold are beneficial.  You may also notice that your appetite is reduced; this is okay as long as you are drinking plenty of clear fluids.    Based on my physical exam findings and the history you have provided  today, I do not recommend antibiotics at this time.  I do not believe the risks and side effects of antibiotics would outweigh any minimal benefit that they might provide.       Ibuprofen  (Advil, Motrin): This is a good anti-inflammatory medication which addresses aches, pains and inflammation of the upper airways that causes sinus and nasal congestion as well as in the lower airways which makes your cough feel tight and sometimes burn.  I recommend that you take between 400 to 600 mg every 6-8 hours as needed.      Acetaminophen (Tylenol): This is a good fever reducer.  If your body temperature rises above 101.5 as measured with a thermometer, it is recommended that you take 1,000 mg every 8 hours until your temperature falls below 101.5, please not take more than 3,000 mg of acetaminophen either as a separate medication or as in ingredient  in an over-the-counter cold/flu preparation within a 24-hour period.      Pseudoephedrine (Sudafed): This is a decongestant.  This medication has to be purchased from the pharmacist counter, I recommend taking 2 tablets, 60 mg, 2-3 times a day as needed to relieve runny nose and sinus drainage.     Guaifenesin (Robitussin, Mucinex): This is an expectorant.  This helps break up chest congestion and loosen up thick nasal drainage making phlegm and drainage more liquid and therefore easier to remove.  I recommend being 400 mg three times daily as needed.      Chloraseptic Throat Spray: Spray 5 sprays into affected area every 2 hours, hold for 15 seconds and either swallow or spit it out.  This is a excellent numbing medication because it is a spray, you can put it right where you needed and so sucking on a lozenge and numbing your entire mouth.       Please remain home from work, school, public places until you have been fever free for 24 hours without the use of antifever medications such as Tylenol or ibuprofen.    Please follow-up within the next 3 to 5 days either with your primary care provider or urgent care if your symptoms do not resolve.  If you do not have a primary care provider, we will assist you in finding one.       This office note has been dictated using Teaching laboratory technician.  Unfortunately, and despite my best efforts, this method of dictation can sometimes lead to occasional typographical or grammatical errors.  I apologize in advance if this occurs.     Theadora Rama Scales, [place] 06/09/21 [phone]

## 2021-06-09 NOTE — ED Triage Notes (Signed)
Pt presents with fever,chills, sore throat x 2 days.

## 2021-06-09 NOTE — Discharge Instructions (Signed)
Your symptoms and physical exam findings are concerning for a viral respiratory infection.   You were tested for both COVID and influenza today because here in the urgent care setting, we do not have an available option for an individual influenza test.  I apologize for the redundancy if you have already taken a COVID test at home.  The result of your viral testing will be posted to your MyChart once it is complete, this typically takes 24 to 48 hours.  If there is a positive result, you will be contacted by phone with further recommendations, if any.     As you may already know, we are unable to perform rapid testing for influenza due to a national shortage of rapid influenza tests.   I recommend that you begin taking Tamiflu now for empiric treatment of presumed influenza based on the history provided to me today along with my physical exam findings. Tamiflu is an antiviral medication that decreases the severity, duration and transmissibility of influenza virus by preventing the virus from reproducing itself in your body.     If the influenza result is positive, please continue the full 5-day course of Tamiflu.  If the result is negative, please feel free to discontinue Tamiflu if you prefer but do keep in mind that Tamiflu can also be taken preventatively.  Finishing the full 5-day course will decrease the chances of catching influenza from anyone else and will not cause harm otherwise.    Conservative care is also recommended at this time.  This includes rest, pushing clear fluids and activity as tolerated.  Warm beverages such as teas and broths versus cold beverages/popsicles and frozen sherbet/sorbet are your choice, both warm and cold are beneficial.  You may also notice that your appetite is reduced; this is okay as long as you are drinking plenty of clear fluids.    Based on my physical exam findings and the history you have provided  today, I do not recommend antibiotics at this time.  I do  not believe the risks and side effects of antibiotics would outweigh any minimal benefit that they might provide.       Ibuprofen  (Advil, Motrin): This is a good anti-inflammatory medication which addresses aches, pains and inflammation of the upper airways that causes sinus and nasal congestion as well as in the lower airways which makes your cough feel tight and sometimes burn.  I recommend that you take between 400 to 600 mg every 6-8 hours as needed.      Acetaminophen (Tylenol): This is a good fever reducer.  If your body temperature rises above 101.5 as measured with a thermometer, it is recommended that you take 1,000 mg every 8 hours until your temperature falls below 101.5, please not take more than 3,000 mg of acetaminophen either as a separate medication or as in ingredient in an over-the-counter cold/flu preparation within a 24-hour period.      Pseudoephedrine (Sudafed): This is a decongestant.  This medication has to be purchased from the pharmacist counter, I recommend taking 2 tablets, 60 mg, 2-3 times a day as needed to relieve runny nose and sinus drainage.     Guaifenesin (Robitussin, Mucinex): This is an expectorant.  This helps break up chest congestion and loosen up thick nasal drainage making phlegm and drainage more liquid and therefore easier to remove.  I recommend being 400 mg three times daily as needed.      Chloraseptic Throat Spray: Spray 5 sprays into affected  area every 2 hours, hold for 15 seconds and either swallow or spit it out.  This is a excellent numbing medication because it is a spray, you can put it right where you needed and so sucking on a lozenge and numbing your entire mouth.      Please remain home from work, school, public places until you have been fever free for 24 hours without the use of antifever medications such as Tylenol or ibuprofen.    Please follow-up within the next 3 to 5 days either with your primary care provider or urgent care if your  symptoms do not resolve.  If you do not have a primary care provider, we will assist you in finding one.

## 2021-06-10 LAB — COVID-19, FLU A+B NAA
Influenza A, NAA: NOT DETECTED
Influenza B, NAA: NOT DETECTED
SARS-CoV-2, NAA: DETECTED — AB

## 2021-10-22 ENCOUNTER — Ambulatory Visit (HOSPITAL_COMMUNITY)
Admission: EM | Admit: 2021-10-22 | Discharge: 2021-10-22 | Disposition: A | Discharge status: Home / Self Care | Payer: Medicaid Other | Attending: Psychiatry | Admitting: Psychiatry

## 2021-10-22 DIAGNOSIS — F319 Bipolar disorder, unspecified: Secondary | ICD-10-CM | POA: Insufficient documentation

## 2021-10-22 DIAGNOSIS — F1290 Cannabis use, unspecified, uncomplicated: Secondary | ICD-10-CM | POA: Insufficient documentation

## 2021-10-22 DIAGNOSIS — F32A Depression, unspecified: Secondary | ICD-10-CM

## 2021-10-22 DIAGNOSIS — Z560 Unemployment, unspecified: Secondary | ICD-10-CM | POA: Insufficient documentation

## 2021-10-22 NOTE — ED Notes (Signed)
Patient discharged by NP, given community resources to follow up with outpatient services. All belongings returned.
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
## 2021-10-22 NOTE — Progress Notes (Signed)
   10/22/21 1147  [facility] Triage Screening (Walk-ins at [facility] only)  What Is the Reason for Your Visit/Call Today? 31 year old Krista Meza reports she seeking assistance for depressive and anxiety symptoms. Symptoms reported: irritable, emotions up and down, panic attacks at least 1x daily and feel like the world is on her shoulders. Denied suicidal/homicidal ideations and denied auditory/visual hallucinations. Denied feelings of paranoia. Report symptoms present starting in March getting progressively worse. 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 History of mental health dx Bipolar, anxiety, depression and PTSD. Currently not taking medication or seeing a therapist. History of taking medication starting at the age of 53, report has not been on any medication the past year and half. Request assistance with finding a therapist.  How Long Has This Been Causing You Problems? 1-6 months  Have You Recently Had Any Thoughts About Hurting Yourself? No  Are You Planning to Commit Suicide/Harm Yourself At This time? No  Have you Recently Had Thoughts About Hurting Someone [name]? No  Are You Planning To Harm Someone At This Time? No  Are you currently experiencing any auditory, visual or other hallucinations? No  Have You Used Any Alcohol or Drugs in the Past 24 Hours? Yes  What Did You Use and How Much? report last drink Monday night while out to dinner.  Do you have any current medical co-morbidities that require immediate attention? No  Clinician description of patient physical appearance/behavior: Patient tearful during triage, dressed appropriately the for the weathre. Cooperative and pleasant.  What Do You Feel Would Help You the Most Today? Stress Management  If access to [facility] was not available, would you have sought care in the Emergency Department? No  Determination of Need Routine (7 days)  Options  For Referral Medication Management;Outpatient Therapy

## 2021-10-22 NOTE — Discharge Instructions (Addendum)
Please contact one of the following facilities to start medication management and/ or therapy services:   [facility] at [facility] [address] [place] [address]  [place], [place] [address] [phone]   [facility]  [address] [place], [place] [address] [phone]  [facility]  [address] R[address], [place] [address] [phone]  [facility]  [address]  [place], [place] [address] [phone]  [facility]  [address] [place], [place] [address] [phone]  [facility]  [address]enee Rival  [place], [place] [address] [phone]   Please come to [facility] (this facility, SECOND FLOOR) during walk in hours for appointment with psychiatrist/provider for further medication management and for therapists for therapy.   Walk-Ins for medication management are available on Monday, Wednesday, Thursday and Friday from 8am-11am.  It is first-come, first -serve; it is best to arrive by 7:00 AM.   Walk-Ins for therapy are available on Monday and Wednesdays 8am-11am. Every 1st and 2nd Friday 1PM-5PM. It is first come, first -serve; it is best to arrive by 7:00 AM.   When you arrive please go upstairs for your appointment. If you are unsure of where to go, inform the front desk that you are here for a walk in appointment and they will assist you with directions upstairs.  Address:  [address] in [place], [address] Ph: [phone]

## 2021-11-19 ENCOUNTER — Encounter: Payer: Self-pay | Admitting: Emergency Medicine

## 2021-11-19 ENCOUNTER — Ambulatory Visit
Admission: EM | Admit: 2021-11-19 | Discharge: 2021-11-19 | Disposition: A | Discharge status: Home / Self Care | Payer: Medicaid Other | Attending: Urgent Care | Admitting: Urgent Care

## 2021-11-19 DIAGNOSIS — R052 Subacute cough: Secondary | ICD-10-CM | POA: Insufficient documentation

## 2021-11-19 DIAGNOSIS — R070 Pain in throat: Secondary | ICD-10-CM | POA: Diagnosis not present

## 2021-11-19 DIAGNOSIS — B349 Viral infection, unspecified: Secondary | ICD-10-CM

## 2021-11-19 LAB — POCT RAPID STREP A (OFFICE): Rapid Strep A Screen: NEGATIVE

## 2021-11-19 MED ORDER — PSEUDOEPHEDRINE HCL 60 MG PO TABS: 60.0000 mg | ORAL_TABLET | Freq: Three times a day (TID) | ORAL | 0 refills | Status: DC | PRN

## 2021-11-19 MED ORDER — PROMETHAZINE-DM 6.25-15 MG/5ML PO SYRP: 2.5000 mL | ORAL_SOLUTION | Freq: Every evening | ORAL | 0 refills | Status: DC | PRN

## 2021-11-19 MED ORDER — LEVOCETIRIZINE DIHYDROCHLORIDE 5 MG PO TABS
5.0000 mg | ORAL_TABLET | Freq: Every evening | ORAL | 0 refills | Status: DC
Start: 2021-11-19 — End: 2022-05-08

## 2021-11-19 MED ORDER — IBUPROFEN 600 MG PO TABS: 600.0000 mg | ORAL_TABLET | Freq: Four times a day (QID) | ORAL | 0 refills | Status: DC | PRN

## 2021-11-19 MED ORDER — BENZONATATE 100 MG PO CAPS: 100.0000 mg | ORAL_CAPSULE | Freq: Three times a day (TID) | ORAL | 0 refills | Status: DC | PRN

## 2021-11-19 NOTE — ED Provider Notes (Signed)
[facility] - URGENT CARE CENTER   MRN: [id number] DOB: 02-10-1991  Subjective:   Krista Meza is a 31 y.o. female presenting for 3-day history of acute onset sinus congestion, sneezing, throat pain, painful swallowing, coughing.  No chest pain, shortness of breath or wheezing.  Patient just started working at a daycare.  Wants testing for COVID, flu and strep.  She is not a smoker.  No current facility-administered medications for this encounter.  Current Outpatient Medications:    naproxen (NAPROSYN) 500 MG tablet, Take 1 tablet (500 mg total) by mouth 2 (two) times daily., Disp: 20 tablet, Rfl: 0   PRESCRIPTION MEDICATION, Nexaplon, Disp: , Rfl:    Allergies  Allergen Reactions   Amoxicillin Hives    Has patient had a PCN reaction causing immediate rash, facial/tongue/throat swelling, SOB or lightheadedness with hypotension: Yes Has patient had a PCN reaction causing severe rash involving mucus membranes or skin necrosis: No Has patient had a PCN reaction that required hospitalization: No Has patient had a PCN reaction occurring within the last 10 years: No If all of the above answers are "NO", then may proceed with Cephalosporin use.    Penicillins Hives    Has patient had a PCN reaction causing immediate rash, facial/tongue/throat swelling, SOB or lightheadedness with hypotension: Yes Has patient had a PCN reaction causing severe rash involving mucus membranes or skin necrosis: No Has patient had a PCN reaction that required hospitalization: No Has patient had a PCN reaction occurring within the last 10 years: No If all of the above answers are "NO", then may proceed with Cephalosporin use.     Past Medical History:  Diagnosis Date   Anxiety    Asthma    as child, inhaler for a year   Condyloma acuminata 10/14/2013   Vaginal white plaque approx 2.5 cm at base of introitus, smooth irregular borders. No pruritis. Biopsy postpartum.    Depression    doing ok    Headache    Heart murmur    palpitations   Heart palpitations    HSV (herpes simplex virus) anogenital infection    Urinary tract infection    Vaginal Pap smear, abnormal    Leep Jan 2019     Past Surgical History:  Procedure Laterality Date   arm surgery Right    CESAREAN SECTION  04/16/2011   Procedure: CESAREAN SECTION;  Surgeon: Roseanna Rainbow, MD;  Location: [facility];  Service: Gynecology;  Laterality: N/A;   LEEP     WISDOM TOOTH EXTRACTION      Family History  Problem Relation Age of Onset   Hypertension Mother    Cancer Father        thyroid   Diabetes Father    Hypertension Maternal Grandmother    Arthritis Maternal Grandmother    Heart disease Paternal Grandmother    Arthritis Paternal Grandmother    Anesthesia problems Neg Hx    Hypotension Neg Hx    Malignant hyperthermia Neg Hx    Pseudochol deficiency Neg Hx     Social History   Tobacco Use   Smoking status: Former    Types: Cigars    Quit date: 04/08/2013    Years since quitting: 8.6   Smokeless tobacco: Never  Vaping Use   Vaping Use: Never used  Substance Use Topics   Alcohol use: No   Drug use: No    ROS   Objective:   Vitals: BP 121/84   Pulse 93  Temp 99.1 F (37.3 C)   Resp 20   SpO2 98%   Physical Exam Constitutional:      General: She is not in acute distress.    Appearance: Normal appearance. She is well-developed. She is not ill-appearing, toxic-appearing or diaphoretic.  HENT:     Head: Normocephalic and atraumatic.     Nose: Nose normal.     Mouth/Throat:     Mouth: Mucous membranes are moist.     Pharynx: No pharyngeal swelling, oropharyngeal exudate, posterior oropharyngeal erythema or uvula swelling.     Tonsils: No tonsillar exudate or tonsillar abscesses. 0 on the right. 0 on the left.  Eyes:     General: No scleral icterus.       Right eye: No discharge.        Left eye: No discharge.     Extraocular Movements: Extraocular movements intact.   Cardiovascular:     Rate and Rhythm: Normal rate and regular rhythm.     Heart sounds: Normal heart sounds. No murmur heard.    No friction rub. No gallop.  Pulmonary:     Effort: Pulmonary effort is normal. No respiratory distress.     Breath sounds: No stridor. No wheezing, rhonchi or rales.  Chest:     Chest wall: No tenderness.  Skin:    General: Skin is warm and dry.  Neurological:     General: No focal deficit present.     Mental Status: She is alert and oriented to person, place, and time.  Psychiatric:        Mood and Affect: Mood normal.        Behavior: Behavior normal.     Results for orders placed or performed during the hospital encounter of 11/19/21 (from the past 24 hour(s))  POCT rapid strep A     Status: None   Collection Time: 11/19/21  8:31 AM  Result Value Ref Range   Rapid Strep A Screen Negative Negative    Assessment and Plan :   PDMP not reviewed this encounter.  1. Acute viral syndrome   2. Throat pain   3. Subacute cough     Respiratory panel pending. Will manage for viral illness such as viral URI, viral syndrome, viral rhinitis, COVID-19, influenza, strep pharyngitis. Recommended supportive care. Offered scripts for symptomatic relief. Testing is pending. Deferred imaging given clear cardiopulmonary exam, hemodynamically stable vital signs. Counseled patient on potential for adverse effects with medications prescribed/recommended today, ER and return-to-clinic precautions discussed, patient verbalized understanding.     Wallis Bamberg, [place] 11/19/21 [phone]

## 2021-11-19 NOTE — ED Triage Notes (Signed)
Pt here with sore throat, congestion and cough x 3 days. Recently started working at a daycare

## 2021-11-20 LAB — COVID-19, FLU A+B NAA
Influenza A, NAA: NOT DETECTED
Influenza B, NAA: NOT DETECTED
SARS-CoV-2, NAA: NOT DETECTED

## 2021-11-21 ENCOUNTER — Telehealth: Payer: Self-pay

## 2021-11-21 ENCOUNTER — Ambulatory Visit
Admission: EM | Admit: 2021-11-21 | Discharge: 2021-11-21 | Disposition: A | Discharge status: Home / Self Care | Payer: Medicaid Other

## 2021-11-21 DIAGNOSIS — J069 Acute upper respiratory infection, unspecified: Secondary | ICD-10-CM | POA: Diagnosis not present

## 2021-11-21 NOTE — Telephone Encounter (Signed)
Attempt to return patient call and voicemail left.

## 2021-11-21 NOTE — ED Triage Notes (Signed)
The patient c/o sore throat, congestion, chest discomfort with cough.  Started 6 days ago  Home interventions: prescribed medications from prior visit

## 2021-11-21 NOTE — Telephone Encounter (Signed)
Patient verification complete (name and date of birth).  Patient returned call, nurse spoke with the patient about her continued symptoms. The patient advised to continue her prescribed medications and if she is not better she could return for re-evaluation (per provider). Patient verified understanding and all questions answered.

## 2021-11-21 NOTE — Discharge Instructions (Signed)
Take previously prescribed medication as prescribed for management of your symptoms.  I have provided you a work note to excuse you from work for today.  You are cleared to return to work on Monday.

## 2021-11-21 NOTE — ED Provider Notes (Signed)
[facility]    CSN: [id number] Arrival date & time: 11/21/21  1230      History   Chief Complaint Chief Complaint  Patient presents with   Nasal Congestion   Sore Throat   Cough    HPI Krista Meza is a 31 y.o. female.   HPI. Patient presents today for follow-up.  Patient was seen 2 days ago with URI symptoms which have now been present for total of 5 days.  During her visit to get days ago she was prescribed medication for symptomatic management of symptoms.  She had not started the Sudafed or the antihistamine reports the medication will be ready for pickup today.  She missed work today due to her symptoms have not readily resolved.  COVID and strep test were both negative. She is requesting extension of her work note to cover her for missing work today. She denies any worrisome symptoms of chest tightness, shortness of breath or wheezing. Past Medical History:  Diagnosis Date   Anxiety    Asthma    as child, inhaler for a year   Condyloma acuminata 10/14/2013   Vaginal white plaque approx 2.5 cm at base of introitus, smooth irregular borders. No pruritis. Biopsy postpartum.    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/10/2017   LGSIL on Pap smear of cervix 01/06/2017   HSV (herpes simplex virus) anogenital infection    Condyloma acuminata 10/14/2013   Obesity (BMI 30-39.9) 09/17/2013   Avitaminosis D 08/03/2011   Chronic depression 07/31/2011   Arthralgia of multiple joints 07/31/2011   Intermittent pain 07/31/2011    Past Surgical History:  Procedure Laterality Date   arm surgery Right    CESAREAN SECTION  04/16/2011    Procedure: CESAREAN SECTION;  Surgeon: Roseanna Rainbow, MD;  Location: [facility];  Service: Gynecology;  Laterality: N/A;   LEEP     WISDOM TOOTH EXTRACTION      OB History     Gravida  2   Para  2   Term  2   Preterm      AB      Living  2      SAB      IAB      Ectopic      Multiple      Live Births  2            Home Medications    Prior to Admission medications   Medication Sig Start Date End Date Taking? Authorizing Provider  benzonatate (TESSALON) 100 MG capsule Take 1-2 capsules (100-200 mg total) by mouth 3 (three) times daily as needed for cough. 11/19/21   Wallis Bamberg, PA-C  ibuprofen (ADVIL) 600 MG tablet Take 1 tablet (600 mg total) by mouth every 6 (six) hours as needed. 11/19/21   Wallis Bamberg, PA-C  levocetirizine (XYZAL) 5 MG tablet Take 1 tablet (5 mg total) by mouth every evening. 11/19/21   Wallis Bamberg, PA-C  naproxen (NAPROSYN) 500 MG tablet Take 1 tablet (500 mg total) by mouth 2 (two) times daily. 12/12/19   Geiple,  Ivin Booty, PA-C  PRESCRIPTION MEDICATION Nexaplon    [provider]  promethazine-dextromethorphan (PROMETHAZINE-DM) 6.25-15 MG/5ML syrup Take 2.5 mLs by mouth at bedtime as needed for cough. 11/19/21   Wallis Bamberg, PA-C  pseudoephedrine (SUDAFED) 60 MG tablet Take 1 tablet (60 mg total) by mouth every 8 (eight) hours as needed for congestion. 11/19/21   Wallis Bamberg, PA-C    Family History Family History  Problem Relation Age of Onset   Hypertension Mother    Cancer Father        thyroid   Diabetes Father    Hypertension Maternal Grandmother    Arthritis Maternal Grandmother    Heart disease Paternal Grandmother    Arthritis Paternal Grandmother    Anesthesia problems Neg Hx    Hypotension Neg Hx    Malignant hyperthermia Neg Hx    Pseudochol deficiency Neg Hx     Social History Social History   Tobacco Use   Smoking status: Former    Types: Cigars    Quit date: 04/08/2013    Years since quitting: 8.6    Smokeless tobacco: Never  Vaping Use   Vaping Use: Never used  Substance Use Topics   Alcohol use: No   Drug use: No     Allergies   Amoxicillin and Penicillins   Review of Systems Review of Systems Pertinent negatives listed in HPI  Physical Exam Triage Vital Signs ED Triage Vitals  Enc Vitals Group     BP 11/21/21 1257 107/70     Pulse Rate 11/21/21 1257 (!) 111     Resp 11/21/21 1257 18     Temp 11/21/21 1257 98.5 F (36.9 C)     Temp Source 11/21/21 1257 Oral     SpO2 11/21/21 1257 95 %     Weight --      Height --      Head Circumference --      Peak Flow --      Pain Score 11/21/21 1256 5     Pain Loc --      Pain Edu? --      Excl. in GC? --    No data found.  Updated Vital Signs BP 107/70 (BP Location: Right Arm)   Pulse (!) 111   Temp 98.5 F (36.9 C) (Oral)   Resp 18   SpO2 95%   Visual Acuity Right Eye Distance:   Left Eye Distance:   Bilateral Distance:    Right Eye Near:   Left Eye Near:    Bilateral Near:     Physical Exam  General Appearance:    Alert, cooperative, no distress  HENT:  Normocephalic, TM normal, nares with rhinorrhea and congestion, cervical adenopathy, negative for sinus tenderness  Eyes:    PERRL, conjunctiva/corneas clear, EOM's intact       Lungs:     Clear to auscultation bilaterally, respirations unlabored  Heart:    Regular rate and rhythm  Neurologic:   Awake, alert, oriented x 3. No apparent focal neurological           defect.        UC Treatments / Results  Labs (all labs ordered are listed, but only abnormal results are displayed) Labs Reviewed - No data to display  EKG   Radiology No results found.  Procedures Procedures (including critical care time)  Medications Ordered in UC Medications - No data to display  Initial Impression / Assessment and Plan / UC Course  I have reviewed  the triage vital signs and the nursing notes.  Pertinent labs & imaging results that were available during my  care of the patient were reviewed by me and considered in my medical decision making (see chart for details).    Viral URI with Cough  Continue with previously prescribed treatment. Work note provided to cover for today's absence. Return if symptoms worsen or do not readily improve with medications prescribed during your visit 2 days ago. Final Clinical Impressions(s) / UC Diagnoses   Final diagnoses:  Viral URI with cough     Discharge Instructions      Take previously prescribed medication as prescribed for management of your symptoms.  I have provided you a work note to excuse you from work for today.  You are cleared to return to work on Monday.    ED Prescriptions   None    PDMP not reviewed this encounter.   Bing Neighbors, FNP 11/21/21 [phone]

## 2021-11-22 LAB — CULTURE, GROUP A STREP (THRC)

## 2021-12-12 ENCOUNTER — Encounter (HOSPITAL_COMMUNITY): Payer: Self-pay | Admitting: Emergency Medicine

## 2021-12-12 ENCOUNTER — Emergency Department (HOSPITAL_COMMUNITY): Payer: Medicaid Other

## 2021-12-12 ENCOUNTER — Other Ambulatory Visit: Payer: Self-pay

## 2021-12-12 ENCOUNTER — Emergency Department (HOSPITAL_COMMUNITY)
Admission: EM | Admit: 2021-12-12 | Discharge: 2021-12-12 | Disposition: A | Discharge status: Home / Self Care | Payer: Medicaid Other | Attending: Emergency Medicine | Admitting: Emergency Medicine

## 2021-12-12 DIAGNOSIS — R42 Dizziness and giddiness: Secondary | ICD-10-CM

## 2021-12-12 DIAGNOSIS — Z23 Encounter for immunization: Secondary | ICD-10-CM | POA: Insufficient documentation

## 2021-12-12 DIAGNOSIS — J45909 Unspecified asthma, uncomplicated: Secondary | ICD-10-CM | POA: Insufficient documentation

## 2021-12-12 DIAGNOSIS — R55 Syncope and collapse: Secondary | ICD-10-CM | POA: Insufficient documentation

## 2021-12-12 LAB — CBC WITH DIFFERENTIAL/PLATELET
Abs Immature Granulocytes: 0.02 10*3/uL (ref 0.00–0.07)
Basophils Absolute: 0 10*3/uL (ref 0.0–0.1)
Basophils Relative: 1 %
Eosinophils Absolute: 0.1 10*3/uL (ref 0.0–0.5)
Eosinophils Relative: 2 %
HCT: 39 % (ref 36.0–46.0)
Hemoglobin: 12.7 g/dL (ref 12.0–15.0)
Immature Granulocytes: 0 %
Lymphocytes Relative: 31 %
Lymphs Abs: 2.1 10*3/uL (ref 0.7–4.0)
MCH: 31 pg (ref 26.0–34.0)
MCHC: 32.6 g/dL (ref 30.0–36.0)
MCV: 95.1 fL (ref 80.0–100.0)
Monocytes Absolute: 0.6 10*3/uL (ref 0.1–1.0)
Monocytes Relative: 9 %
Neutro Abs: 3.9 10*3/uL (ref 1.7–7.7)
Neutrophils Relative %: 57 %
Platelets: 358 10*3/uL (ref 150–400)
RBC: 4.1 MIL/uL (ref 3.87–5.11)
RDW: 13 % (ref 11.5–15.5)
WBC: 6.8 10*3/uL (ref 4.0–10.5)
nRBC: 0 % (ref 0.0–0.2)

## 2021-12-12 LAB — BASIC METABOLIC PANEL
Anion gap: 5 (ref 5–15)
BUN: 8 mg/dL (ref 6–20)
CO2: 25 mmol/L (ref 22–32)
Calcium: 9.1 mg/dL (ref 8.9–10.3)
Chloride: 107 mmol/L (ref 98–111)
Creatinine, Ser: 1.01 mg/dL — ABNORMAL HIGH (ref 0.44–1.00)
GFR, Estimated: >60 mL/min (ref >60)
Glucose, Bld: 92 mg/dL (ref 70–99)
Potassium: 3.6 mmol/L (ref 3.5–5.1)
Sodium: 137 mmol/L (ref 135–145)

## 2021-12-12 LAB — I-STAT BETA HCG BLOOD, ED (MC, WL, AP ONLY): I-stat hCG, quantitative: <5 m[IU]/mL (ref <5)

## 2021-12-12 LAB — TROPONIN I (HIGH SENSITIVITY): Troponin I (High Sensitivity): <2 ng/L (ref <18)

## 2021-12-12 MED ORDER — SODIUM CHLORIDE 0.9 % IV BOLUS
1000.0000 mL | Freq: Once | INTRAVENOUS | Status: AC
  Administered 2021-12-12: 1000 mL via INTRAVENOUS

## 2021-12-12 MED ORDER — TETANUS-DIPHTH-ACELL PERTUSSIS 5-2.5-18.5 LF-MCG/0.5 IM SUSY
0.5000 mL | PREFILLED_SYRINGE | Freq: Once | INTRAMUSCULAR | Status: AC
Start: 2021-12-12 — End: 2021-12-12
  Administered 2021-12-12: 0.5 mL via INTRAMUSCULAR
  Filled 2021-12-12: qty 0.5

## 2021-12-12 NOTE — ED Notes (Signed)
Pt stated that she does not want her vitals taken until she has been seen. Refusing VS

## 2021-12-12 NOTE — ED Triage Notes (Signed)
Pt presents with dizziness/weakness since Saturday when she was out in the sun and had a syncopal episode.  Abrasion to knee and hands/elbow.  Pt states " I haven't been right since then"

## 2021-12-12 NOTE — ED Notes (Signed)
Patient transported to CT 

## 2021-12-12 NOTE — ED Notes (Addendum)
Pt unsteady on feet when standing, RN supporting patient to maintain balance. Pt reports multiple falls due to balance issues recently.

## 2021-12-12 NOTE — Discharge Instructions (Signed)
You came to the emergency department to be evaluated for your syncopal episode and lightheadedness.  Your physical exam, CT imaging, EKG, and lab results were reassuring.  Please follow-up with your primary care doctor for repeat evaluation.  Additionally have placed a consult for cardiology due to your syncopal episode.  Get help right away if: You faint. You hit your head or are injured after fainting. You have any of these symptoms that may indicate trouble with your heart: Fast or irregular heartbeats (palpitations). Unusual pain in your chest, abdomen, or back. Shortness of breath. You have a seizure. You have a severe headache. You are confused. You have vision problems. You have severe weakness or trouble walking. You are bleeding from your mouth or rectum, or you have black or tarry stool.

## 2021-12-12 NOTE — ED Provider Notes (Signed)
[facility] EMERGENCY DEPARTMENT Provider Note   CSN: [id number] Arrival date & time: 12/12/21  1514     History  Chief Complaint  Patient presents with   Dizziness    Krista Meza is a 31 y.o. female with a history of asthma, anxiety, heart palpitations.  Presents to the emergency department complaint of lightheadedness and syncopal episode.  Patient reports that on Saturday, 12/06/2021 she was laying outside by the pool at approximately 11 AM.  Patient went to stand up and get a drink of water when she had a syncopal episode.  Patient denies any preceding lightheadedness, chest pain, shortness of breath, sudden onset of headache.  Patient states that when she regained consciousness she felt as if her speech was slurred.  Patient states that she felt "did not feel normal until hours later."  Patient states that she was at the pool with her children who did not notice her syncopal episode.  Patient states that since then she is having intermittent episodes of lightheadedness.  Patient states that today she had persistent lightheadedness that started while at work today at approximately 10 AM.  Patient states that she has had intermittent episodes of blurred vision since then.  Denies any blurred vision at present.  Denies numbness, weakness, facial asymmetry, dysarthria, chest pain, shortness of breath, abdominal pain, nausea, vomiting, blood in stool, melena, dysuria, hematuria urinary urgency, vaginal pain, vaginal bleeding, vaginal discharge.   Dizziness Associated symptoms: no blood in stool, no chest pain, no headaches, no nausea, no shortness of breath, no vomiting and no weakness        Home Medications Prior to Admission medications   Medication Sig Start Date End Date Taking? Authorizing Provider  benzonatate (TESSALON) 100 MG capsule Take 1-2 capsules (100-200 mg total) by mouth 3 (three) times daily as needed for cough. 11/19/21   Wallis Bamberg, PA-C   ibuprofen (ADVIL) 600 MG tablet Take 1 tablet (600 mg total) by mouth every 6 (six) hours as needed. 11/19/21   Wallis Bamberg, PA-C  levocetirizine (XYZAL) 5 MG tablet Take 1 tablet (5 mg total) by mouth every evening. 11/19/21   Wallis Bamberg, PA-C  naproxen (NAPROSYN) 500 MG tablet Take 1 tablet (500 mg total) by mouth 2 (two) times daily. 12/12/19   Renne Crigler, PA-C  PRESCRIPTION MEDICATION Nexaplon    [provider]  promethazine-dextromethorphan (PROMETHAZINE-DM) 6.25-15 MG/5ML syrup Take 2.5 mLs by mouth at bedtime as needed for cough. 11/19/21   Wallis Bamberg, PA-C  pseudoephedrine (SUDAFED) 60 MG tablet Take 1 tablet (60 mg total) by mouth every 8 (eight) hours as needed for congestion. 11/19/21   Wallis Bamberg, PA-C      Allergies    Amoxicillin and Penicillins    Review of Systems   Review of Systems  Constitutional:  Negative for chills and fever.  Eyes:  Negative for visual disturbance.  Respiratory:  Negative for shortness of breath.   Cardiovascular:  Negative for chest pain.  Gastrointestinal:  Negative for abdominal pain, anal bleeding, blood in stool, nausea and vomiting.  Genitourinary:  Negative for difficulty urinating, dysuria, genital sores, hematuria, urgency, vaginal bleeding, vaginal discharge and vaginal pain.  Musculoskeletal:  Negative for back pain and neck pain.  Skin:  Negative for color change and rash.  Neurological:  Positive for syncope, speech difficulty and light-headedness. Negative for dizziness, tremors, seizures, facial asymmetry, weakness, numbness and headaches.  Psychiatric/Behavioral:  Negative for confusion.     Physical Exam Updated Vital  Signs BP 112/84   Pulse 64   Temp 98.6 F (37 C) (Oral)   Resp 20   LMP 12/08/2021 (Exact Date)   SpO2 100%  Physical Exam Vitals and nursing note reviewed.  Constitutional:      General: She is not in acute distress.    Appearance: She is not ill-appearing, toxic-appearing or diaphoretic.   HENT:     Head: Normocephalic.  Eyes:     General: No scleral icterus.       Right eye: No discharge.        Left eye: No discharge.     Extraocular Movements: Extraocular movements intact.     Conjunctiva/sclera: Conjunctivae normal.     Pupils: Pupils are equal, round, and reactive to light.  Cardiovascular:     Rate and Rhythm: Normal rate.     Pulses:          Radial pulses are 2+ on the right side and 2+ on the left side.     Heart sounds: Normal heart sounds, S1 normal and S2 normal. Heart sounds not distant. No murmur heard. Pulmonary:     Effort: Pulmonary effort is normal. No tachypnea, bradypnea or respiratory distress.     Breath sounds: Normal breath sounds. No stridor.  Musculoskeletal:     Cervical back: Normal range of motion and neck supple. No rigidity.     Comments: Superficial abrasion below left knee  Skin:    General: Skin is warm and dry.  Neurological:     General: No focal deficit present.     Mental Status: She is alert and oriented to person, place, and time.     GCS: GCS eye subscore is 4. GCS verbal subscore is 5. GCS motor subscore is 6.     Cranial Nerves: No cranial nerve deficit or facial asymmetry.     Sensory: Sensation is intact.     Motor: No weakness, tremor, seizure activity or pronator drift.     Coordination: Romberg sign negative. Finger-Nose-Finger Test normal.     Gait: Gait is intact. Gait normal.     Comments: CN II-XII intact, equal grip strength, +5 strength to bilateral upper and lower extremities   Psychiatric:        Behavior: Behavior is cooperative.     ED Results / Procedures / Treatments   Labs (all labs ordered are listed, but only abnormal results are displayed) Labs Reviewed  BASIC METABOLIC PANEL - Abnormal; Notable for the following components:      Result Value   Creatinine, Ser 1.01 (*)    All other components within normal limits  CBC WITH DIFFERENTIAL/PLATELET  I-STAT BETA HCG BLOOD, ED ([facility], [facility], [facility] ONLY)   TROPONIN I (HIGH SENSITIVITY)    EKG None  Radiology DG Chest 2 View  Result Date: 12/12/2021 CLINICAL DATA:  ACS work up EXAM: CHEST - 2 VIEW COMPARISON:  None Available. FINDINGS: The heart size and mediastinal contours are within normal limits. Both lungs are clear. The visualized skeletal structures are unremarkable. IMPRESSION: No active cardiopulmonary disease. Electronically Signed   By: Marjo Bicker M.D.   On: 12/12/2021 16:27    Procedures Procedures    Medications Ordered in ED Medications  sodium chloride 0.9 % bolus 1,000 mL (1,000 mLs Intravenous New Bag/Given 12/12/21 2115)    ED Course/ Medical Decision Making/ A&P  Medical Decision Making Amount and/or Complexity of Data Reviewed Labs: ordered. Radiology: ordered.  Risk Prescription drug management.   Alert 31 year old female no acute distress, nontoxic-appearing.  Presents to the ED with complaint of lightheadedness and syncopal episode.  Information is obtained from patient.  I reviewed patient's past medical records including previous prior notes, labs, and imaging.  Patient has medical history as outlined in HPI which complicates her care.  With reports of syncopal episode on Saturday and lightheadedness today we will check BMP, CBC, troponin, beta-hCG, EKG, orthostatic vital signs.  With reports of episode of slurred speech will on Saturday will check noncontrast head CT to evaluate for acute intracranial abnormality.  We will give patient 1 L fluid bolus at this time.  I personally viewed and interpreted patient's EKG.  Tracing shows sinus then.  I personally viewed and interpreted patient's chest x-ray.  Imaging shows no active cardiopulmonary disease.  I personally viewed and interpreted patient's lab results.  Pertinent findings include: -Beta-hCG less than 5 -Troponin less than 2 -CBC and BMP unremarkable  Patient's orthostatic vital signs were negative for  orthostatic hypotension.   I personally viewed and interpreted patient CT imaging.  Agree with radiology interpretation of no acute intracranial abnormality.  Patient is able to stand and ambulate without difficulty.  Patient hemodynamically stable.  Exact cause of patient's lightheadedness is unknown at this time.  We will have patient follow-up with her PCP and place referral for cardiologist.  Strict return precautions stressed to the patient.  Patient care discussed with attending physician Dr.Zammit.  Based on patient's chief complaint, I considered admission might be necessary, however after reassuring ED workup feel patient is reasonable for discharge.  Discussed results, findings, treatment and follow up. Patient advised of return precautions. Patient verbalized understanding and agreed with plan.  Portions of this note were generated with Scientist, clinical (histocompatibility and immunogenetics). Dictation errors may occur despite best attempts at proofreading.         Final Clinical Impression(s) / ED Diagnoses Final diagnoses:  Lightheadedness  Syncope, unspecified syncope type    Rx / DC Orders ED Discharge Orders          Ordered    Ambulatory referral to Cardiology       Comments: If you have not heard from the Cardiology office within the next 72 hours please call [phone].   12/12/21 2203              Haskel Schroeder, PA-C 12/12/21 2204    Bethann Berkshire, MD 12/14/21 [phone]

## 2021-12-12 NOTE — ED Provider Triage Note (Signed)
Emergency Medicine Provider Triage Evaluation Note  Krista Meza , a 31 y.o. female  was evaluated in triage.  Pt complains of single episode of near syncope.  Patient reports that she had a syncopal episode on Saturday after laying out in the hot sun sunbathing.  Patient states that today since 10:00 she has had feeling of lightheadedness.  Patient reports that she has had blurred vision intermittently today as well.  Patient has not had any syncopal episodes today.  Patient reports that her menstrual period ended either this Monday or Monday past.  Review of Systems  Positive: Blurred vision, syncope, near syncope Negative: Chest pain, shortness of breath, blood in stool, melena  Physical Exam  BP (!) 120/97 (BP Location: Left Arm)   Pulse 74   Temp 99.3 F (37.4 C) (Oral)   Resp 16   SpO2 100%  Gen:   Awake, no distress   Resp:  Normal effort, lungs clear to auscultation bilaterally. MSK:   Moves extremities without difficulty  Other:  EOM intact bilaterally.  Pupils PERRL.  +2 radial pulse bilaterally.  Medical Decision Making  Medically screening exam initiated at 3:35 PM.  Appropriate orders placed.  Lucky Rathke was informed that the remainder of the evaluation will be completed by another provider, this initial triage assessment does not replace that evaluation, and the importance of remaining in the ED until their evaluation is complete.     Haskel Schroeder, [place] 12/12/21 1537

## 2021-12-15 ENCOUNTER — Encounter: Payer: Self-pay | Admitting: Cardiology

## 2021-12-15 ENCOUNTER — Encounter: Payer: Self-pay | Admitting: *Deleted

## 2021-12-15 ENCOUNTER — Ambulatory Visit (INDEPENDENT_AMBULATORY_CARE_PROVIDER_SITE_OTHER): Payer: Medicaid Other | Admitting: Cardiology

## 2021-12-15 ENCOUNTER — Ambulatory Visit (INDEPENDENT_AMBULATORY_CARE_PROVIDER_SITE_OTHER): Payer: Medicaid Other

## 2021-12-15 VITALS — BP 106/68 | HR 82 | Ht 62.0 in | Wt 201.0 lb

## 2021-12-15 DIAGNOSIS — R55 Syncope and collapse: Secondary | ICD-10-CM | POA: Diagnosis not present

## 2021-12-15 NOTE — Progress Notes (Unsigned)
Enrolled for [employer] to mail a ZIO XT long term holter monitor to the patients address on file.  

## 2021-12-15 NOTE — Progress Notes (Signed)
Cardiology Office Note:    Date:  12/15/2021   ID:  Krista Meza, DOB 06/19/1990, MRN [id number]  PCP:  Windell Hummingbird, PA-C   [facility] Providers Cardiologist:  None {     Referring MD: Vinie Sill*    History of Present Illness:    Krista Meza is a 31 y.o. female with a hx of anxiety, asthma, and depression who was referred by Debbe Mounts for further evaluation of syncope.   Patient seen in the ER on 12/12/21 for syncope. Note reviewed. Specifically, she was laying outside by the pool and stood up to take a drink of water where she suffered a syncopal episode. No preceding symptoms. Labs in the ER unremarkable. Trop negative. CT head without acute findings. ECG with NSR without ischemic changes. Orthostatics negative. She is now referred to Cardiology for further evaluation.  Today, the patient states she is overall feeling better. She states that last Saturday she was laying by the pool tanning and she stood up and walked to grab a drink of water when she suddenly lost consciousness. She did not feel lightheaded when she initially stood up to walk. She is unsure if she completely lost consiousness but she does not remember the details of the fall. It took her a time to regain her strength before she could stand up again.  Following the event the patient continued to "feel off" and not "totally with it." Reports some slurred speech for a time but that resolved by the next morning. At work the next day, she started to feel lightheaded, diaphoretic and nauseas with intermittent blurred vision. At that time, she decided to go to the ER as detailed above. Notably, she was on her last day of her menses on the day she suffered her syncopal event.  Otherwise, no chest pain, SOB, orthopnea or PND. No palpitations. Does not have prior history of syncope.  Family history: father with sarcoid, hypothyroidism DMI; mother: Graves, HTN, TIA,  migraines  Past Medical History:  Diagnosis Date   Anxiety    Asthma    as child, inhaler for a year   Condyloma acuminata 10/14/2013   Vaginal white plaque approx 2.5 cm at base of introitus, smooth irregular borders. No pruritis. Biopsy postpartum.    Depression    doing ok   Headache    Heart murmur    palpitations   Heart palpitations    HSV (herpes simplex virus) anogenital infection    Urinary tract infection    Vaginal Pap smear, abnormal    Leep Jan 2019    Past Surgical History:  Procedure Laterality Date   arm surgery Right    CESAREAN SECTION  04/16/2011   Procedure: CESAREAN SECTION;  Surgeon: Agnes Lawrence, MD;  Location: [facility];  Service: Gynecology;  Laterality: N/A;   LEEP     WISDOM TOOTH EXTRACTION      Current Medications: Current Meds  Medication Sig   etonogestrel (NEXPLANON) 68 MG IMPL implant 68 mg by Subdermal route once.   valACYclovir (VALTREX) 1000 MG tablet Take 1,000 mg by mouth 2 (two) times daily as needed (For outbreaks).   Vitamin D, Ergocalciferol, (DRISDOL) 1.25 MG (50000 UNIT) CAPS capsule Take 50,000 Units by mouth once a week.     Allergies:   Amoxicillin and Penicillins   Social History   Socioeconomic History   Marital status: Legally Separated    Spouse name: Not on file   Number of children:  Not on file   Years of education: Not on file   Highest education level: Not on file  Occupational History   Not on file  Tobacco Use   Smoking status: Former    Types: Cigars    Quit date: 04/08/2013    Years since quitting: 8.6   Smokeless tobacco: Never  Vaping Use   Vaping Use: Never used  Substance and Sexual Activity   Alcohol use: No   Drug use: No   Sexual activity: Yes    Birth control/protection: Implant  Other Topics Concern   Not on file  Social History Narrative   Not on file   Social Determinants of Health   Financial Resource Strain: Not on file  Food Insecurity: Not on file  Transportation  Needs: Not on file  Physical Activity: Not on file  Stress: Not on file  Social Connections: Not on file     Family History: The patient's family history includes Arthritis in her maternal grandmother and paternal grandmother; Cancer in her father; Diabetes in her father; Heart disease in her paternal grandmother; Hypertension in her maternal grandmother and mother. There is no history of Anesthesia problems, Hypotension, Malignant hyperthermia, or Pseudochol deficiency.  ROS:   Please see the history of present illness.     All other systems reviewed and are negative.  EKGs/Labs/Other Studies Reviewed:    The following studies were reviewed today: No CV studies  EKG:  EKG 12/12/21 with NSR  Recent Labs: 12/12/2021: BUN 8; Creatinine, Ser 1.01; Hemoglobin 12.7; Platelets 358; Potassium 3.6; Sodium 137  Recent Lipid Panel No results found for: "CHOL", "TRIG", "HDL", "CHOLHDL", "VLDL", "LDLCALC", "LDLDIRECT"   Risk Assessment/Calculations:           Physical Exam:    VS:  BP 106/68   Pulse 82   Ht 5\' 2"  (1.575 m)   Wt 201 lb (91.2 kg)   LMP 12/08/2021 (Exact Date)   SpO2 97%   BMI 36.76 kg/m     Wt Readings from Last 3 Encounters:  12/15/21 201 lb (91.2 kg)  12/12/19 160 lb (72.6 kg)  02/20/19 199 lb (90.3 kg)     GEN:  Well nourished, well developed in no acute distress HEENT: Normal NECK: No JVD; No carotid bruits CARDIAC: RRR, no murmurs, rubs, gallops RESPIRATORY:  Clear to auscultation without rales, wheezing or rhonchi  ABDOMEN: Soft, non-tender, non-distended MUSCULOSKELETAL:  No edema; No deformity  SKIN: Warm and dry NEUROLOGIC:  Alert and oriented x 3 PSYCHIATRIC:  Normal affect   ASSESSMENT:    1. Syncope, unspecified syncope type    PLAN:    In order of problems listed above:  #Syncope: Likely orthostatic as patient syncopized when going from a laying to a standing position after laying out in the pool. ER work-up unremarkable with normal  trop, ECG, CT head. Also sounds like she is having some intermittent vasovagal symptoms including intermittent nausea, lightheadedness and hot flashes. Fortunately, she has had no recurrence of her syncope. She denies any aginal or HF symptoms. Will check zio and TTE for further evaluation. Discussed importance of hydration, small frequent meals, salty snacks, slow position changes and compression therapy. -Check zio -Check TTE -Increase hydration including electrolyte drinks -Small, frequent meals with salty snacks -Slow position changes -Compression socks        Medication Adjustments/Labs and Tests Ordered: Current medicines are reviewed at length with the patient today.  Concerns regarding medicines are outlined above.  Orders Placed This Encounter  Procedures   LONG TERM MONITOR (3-14 DAYS)   ECHOCARDIOGRAM COMPLETE   No orders of the defined types were placed in this encounter.   Patient Instructions  Medication Instructions:   Your physician recommends that you continue on your current medications as directed. Please refer to the Current Medication list given to you today.  *If you need a refill on your cardiac medications before your next appointment, please call your pharmacy*   Testing/Procedures:  Your physician has requested that you have an echocardiogram. Echocardiography is a painless test that uses sound waves to create images of your heart. It provides your doctor with information about the size and shape of your heart and how well your heart's chambers and valves are working. This procedure takes approximately one hour. There are no restrictions for this procedure.   ZIO XT- Long Term Monitor Instructions  Your physician has requested you wear a ZIO patch monitor for 7 days.  This is a single patch monitor. [employer] supplies one patch monitor per enrollment. Additional stickers are not available. Please do not apply patch if you will be having a Nuclear Stress  Test,  Echocardiogram, Cardiac CT, MRI, or Chest Xray during the period you would be wearing the  monitor. The patch cannot be worn during these tests. You cannot remove and re-apply the  ZIO XT patch monitor.  Your ZIO patch monitor will be mailed 3 day USPS to your address on file. It may take 3-5 days  to receive your monitor after you have been enrolled.  Once you have received your monitor, please review the enclosed instructions. Your monitor  has already been registered assigning a specific monitor serial # to you.  Billing and Patient Assistance Program Information  We have supplied [employer] with any of your insurance information on file for billing purposes. [employer] offers a sliding scale Patient Assistance Program for patients that do not have  insurance, or whose insurance does not completely cover the cost of the ZIO monitor.  You must apply for the Patient Assistance Program to qualify for this discounted rate.  To apply, please call [employer] at [phone], select option 4, select option 2, ask to apply for  Patient Assistance Program. Meredeth Ide will ask your household income, and how many people  are in your household. They will quote your out-of-pocket cost based on that information.  [employer] will also be able to set up a 38-month, interest-free payment plan if needed.  Applying the monitor   Shave hair from upper left chest.  Hold abrader disc by orange tab. Rub abrader in 40 strokes over the upper left chest as  indicated in your monitor instructions.  Clean area with 4 enclosed alcohol pads. Let dry.  Apply patch as indicated in monitor instructions. Patch will be placed under collarbone on left  side of chest with arrow pointing upward.  Rub patch adhesive wings for 2 minutes. Remove white label marked "1". Remove the white  label marked "2". Rub patch adhesive wings for 2 additional minutes.  While looking in a mirror, press and release button in center of patch. A  small green light will  flash 3-4 times. This will be your only indicator that the monitor has been turned on.  Do not shower for the first 24 hours. You may shower after the first 24 hours.  Press the button if you feel a symptom. You will hear a small click. Record Date, Time and  Symptom in the Patient Logbook.  When  you are ready to remove the patch, follow instructions on the last 2 pages of Patient  Logbook. Stick patch monitor onto the last page of Patient Logbook.  Place Patient Logbook in the blue and white box. Use locking tab on box and tape box closed  securely. The blue and white box has prepaid postage on it. Please place it in the mailbox as  soon as possible. Your physician should have your test results approximately 7 days after the  monitor has been mailed back to [facility].  Call [facility] at [phone] if you have questions regarding  your ZIO XT patch monitor. Call them immediately if you see an orange light blinking on your  monitor.  If your monitor falls off in less than 4 days, contact our Monitor department at [phone].  If your monitor becomes loose or falls off after 4 days call [employer] at [phone] for  suggestions on securing your monitor    Follow-Up: At [facility], you and your health needs are our priority.  As part of our continuing mission to provide you with exceptional heart care, we have created designated Provider Care Teams.  These Care Teams include your primary Cardiologist (physician) and Advanced Practice Providers (APPs -  Physician Assistants and Nurse Practitioners) who all work together to provide you with the care you need, when you need it.  We recommend signing up for the patient portal called "MyChart".  Sign up information is provided on this After Visit Summary.  MyChart is used to connect with patients for Virtual Visits (Telemedicine).  Patients are able to view lab/test results, encounter notes,  upcoming appointments, etc.  Non-urgent messages can be sent to your provider as well.   To learn more about what you can do with MyChart, go to [email or web address].    Your next appointment:   6 month(s)  The format for your next appointment:   In Person  Provider:   Robbie Lis, PA-C, Nicholes Rough, PA-C, Melina Copa, PA-C, Ambrose Pancoast, NP, Cecilie Kicks, NP, Ermalinda Barrios, PA-C, Christen Bame, NP, or Richardson Dopp, PA-C     {    Important Information About Sugar         Signed, Freada Bergeron, MD  12/15/2021 12:27 PM    [place]

## 2021-12-15 NOTE — Patient Instructions (Signed)
Medication Instructions:   Your physician recommends that you continue on your current medications as directed. Please refer to the Current Medication list given to you today.  *If you need a refill on your cardiac medications before your next appointment, please call your pharmacy*   Testing/Procedures:  Your physician has requested that you have an echocardiogram. Echocardiography is a painless test that uses sound waves to create images of your heart. It provides your doctor with information about the size and shape of your heart and how well your heart's chambers and valves are working. This procedure takes approximately one hour. There are no restrictions for this procedure.   ZIO XT- Long Term Monitor Instructions  Your physician has requested you wear a ZIO patch monitor for 7 days.  This is a single patch monitor. [employer] supplies one patch monitor per enrollment. Additional stickers are not available. Please do not apply patch if you will be having a Nuclear Stress Test,  Echocardiogram, Cardiac CT, MRI, or Chest Xray during the period you would be wearing the  monitor. The patch cannot be worn during these tests. You cannot remove and re-apply the  ZIO XT patch monitor.  Your ZIO patch monitor will be mailed 3 day USPS to your address on file. It may take 3-5 days  to receive your monitor after you have been enrolled.  Once you have received your monitor, please review the enclosed instructions. Your monitor  has already been registered assigning a specific monitor serial # to you.  Billing and Patient Assistance Program Information  We have supplied [employer] with any of your insurance information on file for billing purposes. [employer] offers a sliding scale Patient Assistance Program for patients that do not have  insurance, or whose insurance does not completely cover the cost of the ZIO monitor.  You must apply for the Patient Assistance Program to qualify for this discounted  rate.  To apply, please call [employer] at [phone], select option 4, select option 2, ask to apply for  Patient Assistance Program. Meredeth Ide will ask your household income, and how many people  are in your household. They will quote your out-of-pocket cost based on that information.  [employer] will also be able to set up a 72-month, interest-free payment plan if needed.  Applying the monitor   Shave hair from upper left chest.  Hold abrader disc by orange tab. Rub abrader in 40 strokes over the upper left chest as  indicated in your monitor instructions.  Clean area with 4 enclosed alcohol pads. Let dry.  Apply patch as indicated in monitor instructions. Patch will be placed under collarbone on left  side of chest with arrow pointing upward.  Rub patch adhesive wings for 2 minutes. Remove white label marked "1". Remove the white  label marked "2". Rub patch adhesive wings for 2 additional minutes.  While looking in a mirror, press and release button in center of patch. A small green light will  flash 3-4 times. This will be your only indicator that the monitor has been turned on.  Do not shower for the first 24 hours. You may shower after the first 24 hours.  Press the button if you feel a symptom. You will hear a small click. Record Date, Time and  Symptom in the Patient Logbook.  When you are ready to remove the patch, follow instructions on the last 2 pages of Patient  Logbook. Stick patch monitor onto the last page of Patient Logbook.  Place Patient  Logbook in the blue and white box. Use locking tab on box and tape box closed  securely. The blue and white box has prepaid postage on it. Please place it in the mailbox as  soon as possible. Your physician should have your test results approximately 7 days after the  monitor has been mailed back to [facility].  Call [facility] Customer Care at [phone] if you have questions regarding  your ZIO XT patch monitor. Call them  immediately if you see an orange light blinking on your  monitor.  If your monitor falls off in less than 4 days, contact our Monitor department at [phone].  If your monitor becomes loose or falls off after 4 days call [employer] at [phone] for  suggestions on securing your monitor    Follow-Up: At [facility], you and your health needs are our priority.  As part of our continuing mission to provide you with exceptional heart care, we have created designated Provider Care Teams.  These Care Teams include your primary Cardiologist (physician) and Advanced Practice Providers (APPs -  Physician Assistants and Nurse Practitioners) who all work together to provide you with the care you need, when you need it.  We recommend signing up for the patient portal called "MyChart".  Sign up information is provided on this After Visit Summary.  MyChart is used to connect with patients for Virtual Visits (Telemedicine).  Patients are able to view lab/test results, encounter notes, upcoming appointments, etc.  Non-urgent messages can be sent to your provider as well.   To learn more about what you can do with MyChart, go to [email or web address].    Your next appointment:   6 month(s)  The format for your next appointment:   In Person  Provider:   Chelsea Aus, PA-C, Jari Favre, PA-C, Ronie Spies, PA-C, Robin Searing, NP, Nada Boozer, NP, Jacolyn Reedy, PA-C, Eligha Bridegroom, NP, or Tereso Newcomer, PA-C     {    Important Information About Sugar

## 2021-12-18 DIAGNOSIS — R55 Syncope and collapse: Secondary | ICD-10-CM

## 2022-01-02 ENCOUNTER — Telehealth: Payer: Self-pay | Admitting: Cardiology

## 2022-01-02 ENCOUNTER — Other Ambulatory Visit (HOSPITAL_COMMUNITY): Payer: Medicaid Other

## 2022-01-02 ENCOUNTER — Ambulatory Visit (HOSPITAL_COMMUNITY): Payer: Medicaid Other | Attending: Cardiology

## 2022-01-02 DIAGNOSIS — I340 Nonrheumatic mitral (valve) insufficiency: Secondary | ICD-10-CM

## 2022-01-02 DIAGNOSIS — R55 Syncope and collapse: Secondary | ICD-10-CM | POA: Insufficient documentation

## 2022-01-02 LAB — ECHOCARDIOGRAM COMPLETE
Area-P 1/2: 4.89 cm2
S' Lateral: 2.8 cm

## 2022-01-02 NOTE — Telephone Encounter (Signed)
Spoke with pt who reports she had an episode of walking into a concert venue on Sunday and began sweating, feeling dizzy and lightheaded.  She states she stopped walking and had to stand for about 15 minutes before symptoms passed enough that she could get to a seat to sit down.  She states it took about another 15 minutes for her to feel completely better.   She has completed her monitor and returned it.  She is asking for helpful tips should this continue to happen.  Pt advised first and foremost if she feels like she is going to pass out to not wait and to immediately sit down even if she has to sit in the floor.  Pt also advised to stay well hydrated with water and to add some extra salt to her diet.  Pt does not have a history of HTN.  Pt verbalizes understanding and thanked Charity fundraiser for the callback.

## 2022-01-02 NOTE — Telephone Encounter (Signed)
Pt went to a concert and was about to faint and  ears starting ringing and sweating and felt dizzy and light headed. Just wanted to speak with the nurse about what to do if anything like this happened again.

## 2022-01-05 ENCOUNTER — Telehealth: Payer: Self-pay | Admitting: Cardiology

## 2022-01-05 NOTE — Telephone Encounter (Signed)
Attempted to call the pt back to endorse results, and she did not answer and phone went straight to VM.   Left her a message to call back and if possible have the operator reach out to me or message me, to receive results.

## 2022-01-05 NOTE — Telephone Encounter (Signed)
Patient is calling to talk with Dr. Pemberton or nurse.  

## 2022-01-06 NOTE — Telephone Encounter (Signed)
Left the pt another message to call the office back for results.

## 2022-01-09 NOTE — Telephone Encounter (Signed)
Results sent to the pts mychart account to review, being we're unable to make contact with her via phone.

## 2022-01-14 ENCOUNTER — Encounter: Payer: Self-pay | Admitting: *Deleted

## 2022-01-14 NOTE — Telephone Encounter (Signed)
Result letter to follow-up with our office, will be sent today to the pts mailing address on file.   Will close this encounter and refer to it as needed.

## 2022-01-20 ENCOUNTER — Ambulatory Visit (INDEPENDENT_AMBULATORY_CARE_PROVIDER_SITE_OTHER): Payer: Medicaid Other | Admitting: Advanced Practice Midwife

## 2022-01-20 ENCOUNTER — Encounter: Payer: Self-pay | Admitting: Advanced Practice Midwife

## 2022-01-20 ENCOUNTER — Other Ambulatory Visit (HOSPITAL_COMMUNITY)
Admission: RE | Admit: 2022-01-20 | Discharge: 2022-01-20 | Disposition: A | Discharge status: Home / Self Care | Payer: Medicaid Other | Source: Ambulatory Visit | Attending: Advanced Practice Midwife | Admitting: Advanced Practice Midwife

## 2022-01-20 VITALS — BP 109/78 | HR 95 | Ht 62.0 in | Wt 202.3 lb

## 2022-01-20 DIAGNOSIS — Z113 Encounter for screening for infections with a predominantly sexual mode of transmission: Secondary | ICD-10-CM

## 2022-01-20 DIAGNOSIS — D069 Carcinoma in situ of cervix, unspecified: Secondary | ICD-10-CM

## 2022-01-20 DIAGNOSIS — Z3046 Encounter for surveillance of implantable subdermal contraceptive: Secondary | ICD-10-CM | POA: Diagnosis not present

## 2022-01-20 NOTE — Progress Notes (Signed)
Pt state was PAP was done last month in [place]. Pt requesting breast exam and STI testing.

## 2022-01-20 NOTE — Progress Notes (Signed)
GYNECOLOGY CLINIC PROCEDURE NOTE  Krista Meza is a 31 y.o. [phone] here for Nexplanon removal.  Last pap smear was on 12/03/21 and was normal. Pt with LEEP in 2021, with 2 normal Pap results since LEEP. F/U with [facility] but Pap in 1 year per guidelines.   No other gynecologic concerns.  Nexplanon Removal Patient identified, informed consent performed, consent signed.   Appropriate time out taken. Nexplanon site identified.  Area prepped in usual sterile fashon. One ml of 1% lidocaine was used to anesthetize the area at the distal end of the implant. A small stab incision was made right beside the implant on the distal portion.  The Nexplanon rod was grasped using hemostats and removed without difficulty.  There was minimal blood loss. There were no complications.  3 ml of 1% lidocaine was injected around the incision for post-procedure analgesia.  Steri-strips were applied over the small incision.  A pressure bandage was applied to reduce any bruising.  The patient tolerated the procedure well and was given post procedure instructions.  Patient is planning to use abstinence for contraception and will return to care if she desires another method.   1. Routine screening for STI (sexually transmitted infection)  - Cervicovaginal ancillary only( [place]) - HIV antibody (with reflex) - Hepatitis C Antibody - Hepatitis B Surface AntiGEN - RPR  2. Encounter for Nexplanon removal    Sharen Counter, CNM 3:45 PM

## 2022-01-21 LAB — CERVICOVAGINAL ANCILLARY ONLY
Chlamydia: NEGATIVE
Comment: NEGATIVE
Comment: NEGATIVE
Comment: NORMAL
Neisseria Gonorrhea: NEGATIVE
Trichomonas: NEGATIVE

## 2022-01-21 LAB — HEPATITIS C ANTIBODY: Hep C Virus Ab: NONREACTIVE

## 2022-01-21 LAB — HIV ANTIBODY (ROUTINE TESTING W REFLEX): HIV Screen 4th Generation wRfx: NONREACTIVE

## 2022-01-21 LAB — RPR: RPR Ser Ql: NONREACTIVE

## 2022-01-21 LAB — HEPATITIS B SURFACE ANTIGEN: Hepatitis B Surface Ag: NEGATIVE

## 2022-03-23 ENCOUNTER — Other Ambulatory Visit (HOSPITAL_COMMUNITY)
Admission: RE | Admit: 2022-03-23 | Discharge: 2022-03-23 | Disposition: A | Discharge status: Home / Self Care | Payer: Medicaid Other | Source: Ambulatory Visit | Attending: Obstetrics and Gynecology | Admitting: Obstetrics and Gynecology

## 2022-03-23 ENCOUNTER — Ambulatory Visit (INDEPENDENT_AMBULATORY_CARE_PROVIDER_SITE_OTHER): Payer: Medicaid Other | Admitting: General Practice

## 2022-03-23 VITALS — BP 122/68 | HR 83 | Ht 62.0 in | Wt 197.6 lb

## 2022-03-23 DIAGNOSIS — Z113 Encounter for screening for infections with a predominantly sexual mode of transmission: Secondary | ICD-10-CM | POA: Insufficient documentation

## 2022-03-23 DIAGNOSIS — N898 Other specified noninflammatory disorders of vagina: Secondary | ICD-10-CM

## 2022-03-23 LAB — POCT URINE PREGNANCY: Preg Test, Ur: NEGATIVE

## 2022-03-23 MED ORDER — FLUCONAZOLE 150 MG PO TABS: 150.0000 mg | ORAL_TABLET | Freq: Once | ORAL | 0 refills | Status: AC

## 2022-03-23 NOTE — Progress Notes (Signed)
SUBJECTIVE:  31 y.o. female complains of vaginal itching for 1 week(s). Denies abnormal vaginal bleeding or significant pelvic pain or fever. No UTI symptoms. Denies history of known exposure to STD.  No LMP recorded. Patient has had an implant.  OBJECTIVE:  She appears well, afebrile. Urine dipstick: not done.  ASSESSMENT:  Vaginal Discharge  Vaginal Odor   PLAN:  GC, chlamydia, trichomonas, BVAG, CVAG probe sent to lab. Treatment: Diflucan sent per protocol. Further treatment to be determined once lab results are received ROV prn if symptoms persist or worsen.

## 2022-03-24 LAB — CERVICOVAGINAL ANCILLARY ONLY
Bacterial Vaginitis (gardnerella): POSITIVE — AB
Candida Glabrata: NEGATIVE
Candida Vaginitis: NEGATIVE
Chlamydia: NEGATIVE
Comment: NEGATIVE
Comment: NEGATIVE
Comment: NEGATIVE
Comment: NEGATIVE
Comment: NEGATIVE
Comment: NORMAL
Neisseria Gonorrhea: NEGATIVE
Trichomonas: NEGATIVE

## 2022-03-24 LAB — HIV ANTIBODY (ROUTINE TESTING W REFLEX): HIV Screen 4th Generation wRfx: NONREACTIVE

## 2022-03-24 LAB — HEPATITIS B SURFACE ANTIGEN: Hepatitis B Surface Ag: NEGATIVE

## 2022-03-24 LAB — RPR: RPR Ser Ql: NONREACTIVE

## 2022-03-24 LAB — HEPATITIS C ANTIBODY: Hep C Virus Ab: NONREACTIVE

## 2022-03-25 ENCOUNTER — Other Ambulatory Visit: Payer: Self-pay | Admitting: *Deleted

## 2022-03-25 MED ORDER — FLUCONAZOLE 150 MG PO TABS: 150.0000 mg | ORAL_TABLET | Freq: Once | ORAL | 0 refills | Status: AC

## 2022-03-25 MED ORDER — METRONIDAZOLE 500 MG PO TABS: 500.0000 mg | ORAL_TABLET | Freq: Two times a day (BID) | ORAL | 1 refills | Status: DC

## 2022-03-25 NOTE — Progress Notes (Signed)
TC from pt regarding results from vaginal swab 03/23/22. Positive for BV. Pt reports usually needs 2 rounds of TX for BV and sometimes has yeast infection following Mineral. RX Flagyl with 1 refill and RX Diflucan sent.

## 2022-05-08 ENCOUNTER — Ambulatory Visit
Admission: EM | Admit: 2022-05-08 | Discharge: 2022-05-08 | Disposition: A | Discharge status: Home / Self Care | Payer: Medicaid Other | Attending: Urgent Care | Admitting: Urgent Care

## 2022-05-08 DIAGNOSIS — Z113 Encounter for screening for infections with a predominantly sexual mode of transmission: Secondary | ICD-10-CM | POA: Diagnosis present

## 2022-05-08 DIAGNOSIS — J069 Acute upper respiratory infection, unspecified: Secondary | ICD-10-CM | POA: Insufficient documentation

## 2022-05-08 DIAGNOSIS — B3731 Acute candidiasis of vulva and vagina: Secondary | ICD-10-CM | POA: Insufficient documentation

## 2022-05-08 LAB — POCT INFLUENZA A/B
Influenza A, POC: NEGATIVE
Influenza B, POC: NEGATIVE

## 2022-05-08 LAB — POCT URINALYSIS DIP (MANUAL ENTRY)
Bilirubin, UA: NEGATIVE
Blood, UA: NEGATIVE
Glucose, UA: NEGATIVE mg/dL
Ketones, POC UA: NEGATIVE mg/dL
Leukocytes, UA: NEGATIVE
Nitrite, UA: NEGATIVE
Protein Ur, POC: NEGATIVE mg/dL
Spec Grav, UA: 1.025 (ref 1.010–1.025)
Urobilinogen, UA: 0.2 E.U./dL
pH, UA: 6 (ref 5.0–8.0)

## 2022-05-08 LAB — POCT URINE PREGNANCY: Preg Test, Ur: NEGATIVE

## 2022-05-08 MED ORDER — FLUCONAZOLE 150 MG PO TABS: ORAL_TABLET | ORAL | 0 refills | Status: DC

## 2022-05-08 NOTE — ED Triage Notes (Signed)
Pt c/o diarrhea x 1 episode yesterday, nasal congestion started last night-also c/o vaginal d/c x 4 days-NAD-steady gait

## 2022-05-08 NOTE — Discharge Instructions (Signed)
Your flu test is negative. Your symptoms are likely related to adenovirus. Treatment is supportive including rest and hydration. You may also use an over-the-counter nasal spray such as Flonase.  We have tested you today for sexually transmitted infections. We will notify you if any tests are positive. I have called in Diflucan to help with your vaginal irritation and itching.

## 2022-05-08 NOTE — ED Provider Notes (Signed)
[facility]    CSN: [id number] Arrival date & time: 05/08/22  0801      History   Chief Complaint No chief complaint on file.   HPI Krista Meza is a 31 y.o. female.   Pleasant 31 year old female presents today due to concerns of nasal congestion, slight bodyaches, subjective fever, and 1 episode of diarrhea.  States that her diarrhea started yesterday after her "stomach started doing cart wheels at work".  States she did not sleep well last night due to feeling hot.  Did not check her temperature, 99.4 upon triage today.  States she feels nasal congestion and has coughed a few times.  Denies any chest congestion, shortness of breath, chest pain.  Has not taken any over-the-counter medications for her symptoms. Additionally, patient reports a several day history of vaginal itching.  She states there is a cottage cheesy white discharge.  She is due for her menstrual period in a few days.  Denies any pelvic pain, dysuria, hematuria, flank pain, fever, pelvic pain.  She is requesting full STI workup, however denies any recent sexual encounters.     Past Medical History:  Diagnosis Date   Anxiety    Asthma    as child, inhaler for a year   Condyloma acuminata 10/14/2013   Vaginal white plaque approx 2.5 cm at base of introitus, smooth irregular borders. No pruritis. Biopsy postpartum.    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/10/2017   LGSIL on Pap smear of cervix 01/06/2017   HSV (herpes simplex virus) anogenital infection    Condyloma acuminata 10/14/2013   Obesity (BMI 30-39.9) 09/17/2013   Avitaminosis D 08/03/2011   Chronic depression  07/31/2011   Arthralgia of multiple joints 07/31/2011   Intermittent pain 07/31/2011    Past Surgical History:  Procedure Laterality Date   arm surgery Right    CESAREAN SECTION  04/16/2011   Procedure: CESAREAN SECTION;  Surgeon: Roseanna Rainbow, MD;  Location: [facility];  Service: Gynecology;  Laterality: N/A;   KNEE SURGERY Right 09/2020   LEEP     SUBMANDIBULAR GLAND EXCISION Right 09/2020   WISDOM TOOTH EXTRACTION      OB History     Gravida  3   Para  2   Term  2   Preterm      AB  1   Living  2      SAB  1   IAB      Ectopic      Multiple      Live Births  2            Home Medications    Prior to Admission medications   Medication Sig Start Date End Date Taking? Authorizing Provider  fluconazole (DIFLUCAN) 150 MG tablet Take one tab PO x 1 dose today. May repeat in 3 days if symptoms persist 05/08/22  Yes Shaketha Jeon L, PA  gabapentin (NEURONTIN) 100 MG capsule  05/06/22  Yes [provider]  predniSONE (DELTASONE) 20 MG tablet  05/06/22  Yes [provider]  valACYclovir (VALTREX) 1000  MG tablet Take 1,000 mg by mouth 2 (two) times daily as needed (For outbreaks). 12/05/21   [provider]    Family History Family History  Problem Relation Age of Onset   Hypertension Mother    Cancer Father        thyroid   Diabetes Father    Hypertension Maternal Grandmother    Arthritis Maternal Grandmother    Heart disease Paternal Grandmother    Arthritis Paternal Grandmother    Anesthesia problems Neg Hx    Hypotension Neg Hx    Malignant hyperthermia Neg Hx    Pseudochol deficiency Neg Hx     Social History Social History   Tobacco Use   Smoking status: Former    Types: Cigars    Quit date: 04/08/2013    Years since quitting: 9.0   Smokeless tobacco: Never  Vaping Use   Vaping Use: Never used  Substance Use Topics   Alcohol use: No   Drug use: No     Allergies   Amoxicillin and  Penicillins   Review of Systems Review of Systems As per HPI  Physical Exam Triage Vital Signs ED Triage Vitals  Enc Vitals Group     BP 05/08/22 0807 129/84     Pulse Rate 05/08/22 0807 96     Resp 05/08/22 0807 16     Temp 05/08/22 0807 99.4 F (37.4 C)     Temp Source 05/08/22 0807 Oral     SpO2 05/08/22 0807 98 %     Weight --      Height --      Head Circumference --      Peak Flow --      Pain Score 05/08/22 0813 0     Pain Loc --      Pain Edu? --      Excl. in GC? --    No data found.  Updated Vital Signs BP 129/84 (BP Location: Left Arm)   Pulse 96   Temp 99.4 F (37.4 C) (Oral)   Resp 16   LMP 04/12/2022   SpO2 98%   Visual Acuity Right Eye Distance:   Left Eye Distance:   Bilateral Distance:    Right Eye Near:   Left Eye Near:    Bilateral Near:     Physical Exam Vitals and nursing note reviewed.  Constitutional:      General: She is not in acute distress.    Appearance: Normal appearance. She is well-developed. She is not ill-appearing, toxic-appearing or diaphoretic.  HENT:     Head: Normocephalic and atraumatic.     Right Ear: Tympanic membrane, ear canal and external ear normal. There is no impacted cerumen.     Left Ear: Tympanic membrane, ear canal and external ear normal. There is no impacted cerumen.     Nose: Nose normal. No congestion or rhinorrhea.     Mouth/Throat:     Mouth: Mucous membranes are moist.     Pharynx: Oropharynx is clear. No oropharyngeal exudate or posterior oropharyngeal erythema.  Eyes:     General: No scleral icterus.       Right eye: No discharge.        Left eye: No discharge.     Extraocular Movements: Extraocular movements intact.     Conjunctiva/sclera: Conjunctivae normal.     Pupils: Pupils are equal, round, and reactive to light.  Cardiovascular:     Rate and Rhythm: Normal rate and regular rhythm.  Pulses: Normal pulses.     Heart sounds: Normal heart sounds. No murmur heard. Pulmonary:      Effort: Pulmonary effort is normal. No respiratory distress.     Breath sounds: Normal breath sounds. No stridor. No wheezing, rhonchi or rales.  Chest:     Chest wall: No tenderness.  Abdominal:     General: Abdomen is flat. Bowel sounds are normal. There is no distension.     Palpations: Abdomen is soft. There is no mass.     Tenderness: There is no abdominal tenderness. There is no right CVA tenderness, left CVA tenderness, guarding or rebound.  Musculoskeletal:        General: No swelling or signs of injury.     Cervical back: Normal range of motion and neck supple. No rigidity or tenderness.  Lymphadenopathy:     Cervical: No cervical adenopathy.  Skin:    General: Skin is warm and dry.     Capillary Refill: Capillary refill takes less than 2 seconds.     Coloration: Skin is not jaundiced.     Findings: No bruising, erythema or rash.  Neurological:     General: No focal deficit present.     Mental Status: She is alert and oriented to person, place, and time.  Psychiatric:        Mood and Affect: Mood normal.      UC Treatments / Results  Labs (all labs ordered are listed, but only abnormal results are displayed) Labs Reviewed  RPR  HIV ANTIBODY (ROUTINE TESTING W REFLEX)  POCT URINALYSIS DIP (MANUAL ENTRY)  POCT URINE PREGNANCY  POCT INFLUENZA A/B  CERVICOVAGINAL ANCILLARY ONLY    EKG   Radiology No results found.  Procedures Procedures (including critical care time)  Medications Ordered in UC Medications - No data to display  Initial Impression / Assessment and Plan / UC Course  I have reviewed the triage vital signs and the nursing notes.  Pertinent labs & imaging results that were available during my care of the patient were reviewed by me and considered in my medical decision making (see chart for details).     Routine STI screening -completed in office per patient request.  No known exposures, will contact patient if any positive results. Vaginal  candidiasis -patient with history of the same.  Will treat with p.o. Diflucan x 1 today, may repeat in 3 days if symptoms persist.  Aptima swab collected.  Abstain from intercourse until test results obtained Viral URI -vital signs stable.  Flu test negative.  Supportive measures appear appropriate at this time, clinically suspect possible developing adenovirus.   Final Clinical Impressions(s) / UC Diagnoses   Final diagnoses:  Routine screening for STI (sexually transmitted infection)  Vaginal candidiasis  Viral upper respiratory infection     Discharge Instructions      Your flu test is negative. Your symptoms are likely related to adenovirus. Treatment is supportive including rest and hydration. You may also use an over-the-counter nasal spray such as Flonase.  We have tested you today for sexually transmitted infections. We will notify you if any tests are positive. I have called in Diflucan to help with your vaginal irritation and itching.     ED Prescriptions     Medication Sig Dispense Auth. Provider   fluconazole (DIFLUCAN) 150 MG tablet Take one tab PO x 1 dose today. May repeat in 3 days if symptoms persist 2 tablet Maretta Beesrain, Graylen Noboa L, [place]      PDMP  not reviewed this encounter.   Maretta Bees, [place] 05/08/22 [phone]

## 2022-05-09 LAB — SYPHILIS: RPR W/REFLEX TO RPR TITER AND TREPONEMAL ANTIBODIES, TRADITIONAL SCREENING AND DIAGNOSIS ALGORITHM: RPR Ser Ql: NONREACTIVE

## 2022-05-09 LAB — HIV ANTIBODY (ROUTINE TESTING W REFLEX): HIV Screen 4th Generation wRfx: NONREACTIVE

## 2022-05-11 LAB — CERVICOVAGINAL ANCILLARY ONLY
Bacterial Vaginitis (gardnerella): POSITIVE — AB
Candida Glabrata: NEGATIVE
Candida Vaginitis: NEGATIVE
Chlamydia: NEGATIVE
Comment: NEGATIVE
Comment: NEGATIVE
Comment: NEGATIVE
Comment: NEGATIVE
Comment: NEGATIVE
Comment: NORMAL
Neisseria Gonorrhea: NEGATIVE
Trichomonas: NEGATIVE

## 2022-05-12 ENCOUNTER — Telehealth (HOSPITAL_COMMUNITY): Payer: Self-pay | Admitting: Emergency Medicine

## 2022-05-12 MED ORDER — METRONIDAZOLE 500 MG PO TABS: 500.0000 mg | ORAL_TABLET | Freq: Two times a day (BID) | ORAL | 0 refills | Status: DC

## 2022-06-30 ENCOUNTER — Ambulatory Visit
Admission: EM | Admit: 2022-06-30 | Discharge: 2022-06-30 | Disposition: A | Discharge status: Home / Self Care | Payer: Medicaid Other | Attending: Nurse Practitioner | Admitting: Nurse Practitioner

## 2022-06-30 DIAGNOSIS — N926 Irregular menstruation, unspecified: Secondary | ICD-10-CM | POA: Diagnosis not present

## 2022-06-30 DIAGNOSIS — S60419A Abrasion of unspecified finger, initial encounter: Secondary | ICD-10-CM | POA: Diagnosis not present

## 2022-06-30 DIAGNOSIS — N898 Other specified noninflammatory disorders of vagina: Secondary | ICD-10-CM

## 2022-06-30 DIAGNOSIS — N760 Acute vaginitis: Secondary | ICD-10-CM | POA: Diagnosis not present

## 2022-06-30 LAB — POCT URINALYSIS DIP (MANUAL ENTRY)
Bilirubin, UA: NEGATIVE
Blood, UA: NEGATIVE
Glucose, UA: NEGATIVE mg/dL
Ketones, POC UA: NEGATIVE mg/dL
Leukocytes, UA: NEGATIVE
Nitrite, UA: NEGATIVE
Protein Ur, POC: NEGATIVE mg/dL
Spec Grav, UA: 1.015 (ref 1.010–1.025)
Urobilinogen, UA: 0.2 E.U./dL
pH, UA: 6.5 (ref 5.0–8.0)

## 2022-06-30 LAB — POCT URINE PREGNANCY: Preg Test, Ur: NEGATIVE

## 2022-06-30 MED ORDER — METRONIDAZOLE 500 MG PO TABS: 500.0000 mg | ORAL_TABLET | Freq: Two times a day (BID) | ORAL | 0 refills | Status: AC

## 2022-06-30 MED ORDER — MUPIROCIN CALCIUM 2 % EX CREA: 1.0000 | TOPICAL_CREAM | Freq: Three times a day (TID) | CUTANEOUS | 0 refills | Status: AC

## 2022-06-30 MED ORDER — FLUCONAZOLE 150 MG PO TABS: 150.0000 mg | ORAL_TABLET | Freq: Every day | ORAL | 0 refills | Status: DC

## 2022-06-30 NOTE — ED Triage Notes (Signed)
Pt reports having brownish/yellow discharge.   Started: 1.5 weeks ago  Home interventions: none   Pt reports having pain (possible infection) to right ring finger.

## 2022-06-30 NOTE — Discharge Instructions (Addendum)
Metronidazole twice daily for 7 days.  Please do not drink alcohol while on this medication Diflucan as prescribed Keep your start topical antibiotic ointment 3 times a day to the finger wound.  Keep clean and dry The clinic will contact you with results of your urine culture or vaginal swab are positive and indicate any additional testing Rest and fluids Follow-up with your PCP in 2 days for recheck Please go to the emergency room if you have any worsening symptoms

## 2022-06-30 NOTE — ED Provider Notes (Addendum)
[facility]    CSN: [id number] Arrival date & time: 06/30/22  0804      History   Chief Complaint Chief Complaint  Patient presents with   Vaginal Discharge    HPI Krista Meza is a 32 y.o. female presents for evaluation of vaginal discharge and possible finger infection.  Patient reports 1 and half weeks of a brown/yellow thick malodorous discharge.  Denies any dysuria, fevers, nausea/vomiting, flank pain.  Her last menstrual period was on January 09/2022.  She is not on birth control.  She is interested in a pregnancy test. Reports history of both BV and yeast infections.  States her prescription symptoms feel similar to both.  She has not used any OTC medications.  In addition she reports a week ago she caught her right third finger between the couch and the wall.  It caused a small cut that bled but she is now concerned it might be infected.  Reports some mild swelling and occasional drainage but no warmth or fevers.  No history of MRSA.  She is up-to-date on her tetanus.  No other concerns at this time.   Vaginal Discharge   Past Medical History:  Diagnosis Date   Anxiety    Asthma    as child, inhaler for a year   Condyloma acuminata 10/14/2013   Vaginal white plaque approx 2.5 cm at base of introitus, smooth irregular borders. No pruritis. Biopsy postpartum.    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/10/2017   LGSIL on Pap smear of cervix 01/06/2017   HSV (herpes simplex virus) anogenital infection    Condyloma acuminata 10/14/2013   Obesity (BMI 30-39.9) 09/17/2013   Avitaminosis D 08/03/2011   Chronic depression 07/31/2011    Arthralgia of multiple joints 07/31/2011   Intermittent pain 07/31/2011    Past Surgical History:  Procedure Laterality Date   arm surgery Right    CESAREAN SECTION  04/16/2011   Procedure: CESAREAN SECTION;  Surgeon: Agnes Lawrence, MD;  Location: [facility];  Service: Gynecology;  Laterality: N/A;   KNEE SURGERY Right 09/2020   LEEP     SUBMANDIBULAR GLAND EXCISION Right 09/2020   WISDOM TOOTH EXTRACTION      OB History     Gravida  3   Para  2   Term  2   Preterm      AB  1   Living  2      SAB  1   IAB      Ectopic      Multiple      Live Births  2            Home Medications    Prior to Admission medications   Medication Sig Start Date End Date Taking? Authorizing Provider  fluconazole (DIFLUCAN) 150 MG tablet Take 1 tablet (150 mg total) by mouth daily. Take 1 by mouth once and then may repeat in 3 days if symptoms persist 06/30/22  Yes Melynda Ripple, NP  metroNIDAZOLE (FLAGYL) 500 MG tablet Take 1 tablet (500 mg total) by mouth 2 (  two) times daily for 7 days. 06/30/22 07/07/22 Yes Melynda Ripple, NP  mupirocin cream (BACTROBAN) 2 % Apply 1 Application topically 3 (three) times daily for 7 days. 06/30/22 07/07/22 Yes Melynda Ripple, NP  gabapentin (NEURONTIN) 100 MG capsule  05/06/22   [provider]  predniSONE (DELTASONE) 20 MG tablet  05/06/22   [provider]  valACYclovir (VALTREX) 1000 MG tablet Take 1,000 mg by mouth 2 (two) times daily as needed (For outbreaks). 12/05/21   [provider]    Family History Family History  Problem Relation Age of Onset   Hypertension Mother    Cancer Father        thyroid   Diabetes Father    Hypertension Maternal Grandmother    Arthritis Maternal Grandmother    Heart disease Paternal Grandmother    Arthritis Paternal Grandmother    Anesthesia problems Neg Hx    Hypotension Neg Hx    Malignant hyperthermia Neg Hx    Pseudochol deficiency Neg Hx     Social History Social  History   Tobacco Use   Smoking status: Former    Types: Cigars    Quit date: 04/08/2013    Years since quitting: 9.2   Smokeless tobacco: Never  Vaping Use   Vaping Use: Never used  Substance Use Topics   Alcohol use: No   Drug use: No     Allergies   Amoxicillin and Penicillins   Review of Systems Review of Systems  Genitourinary:  Positive for vaginal discharge.  Skin:        Finger injury     Physical Exam Triage Vital Signs ED Triage Vitals  Enc Vitals Group     BP 06/30/22 0819 121/74     Pulse Rate 06/30/22 0819 72     Resp 06/30/22 0819 18     Temp 06/30/22 0819 (!) 97.5 F (36.4 C)     Temp Source 06/30/22 0819 Oral     SpO2 06/30/22 0819 98 %     Weight --      Height --      Head Circumference --      Peak Flow --      Pain Score 06/30/22 0821 6     Pain Loc --      Pain Edu? --      Excl. in [place]? --    No data found.  Updated Vital Signs BP 121/74 (BP Location: Right Arm)   Pulse 72   Temp (!) 97.5 F (36.4 C) (Oral)   Resp 18   LMP 06/08/2022   SpO2 98%   Visual Acuity Right Eye Distance:   Left Eye Distance:   Bilateral Distance:    Right Eye Near:   Left Eye Near:    Bilateral Near:     Physical Exam Vitals and nursing note reviewed.  Constitutional:      Appearance: Normal appearance.  HENT:     Head: Normocephalic and atraumatic.  Eyes:     Pupils: Pupils are equal, round, and reactive to light.  Cardiovascular:     Rate and Rhythm: Normal rate.  Pulmonary:     Effort: Pulmonary effort is normal.  Abdominal:     Tenderness: There is no right CVA tenderness or left CVA tenderness.  Skin:    General: Skin is warm and dry.     Comments: To the distal lateral aspect of the right third finger there is a small abrasion adjacent to the  nailbed.  Mild swelling without erythema, warmth.  No fluctuance or induration.  Cap refill +2.  Neurological:     General: No focal deficit present.     Mental Status: She is alert and  oriented to person, place, and time.  Psychiatric:        Mood and Affect: Mood normal.        Behavior: Behavior normal.      UC Treatments / Results  Labs (all labs ordered are listed, but only abnormal results are displayed) Labs Reviewed  POCT URINALYSIS DIP (MANUAL ENTRY) - Abnormal; Notable for the following components:      Result Value   Color, UA light yellow (*)    Clarity, UA cloudy (*)    All other components within normal limits  URINE CULTURE  BETA HCG QUANT (REF LAB)  POCT URINE PREGNANCY  CERVICOVAGINAL ANCILLARY ONLY    EKG   Radiology No results found.  Procedures Procedures (including critical care time)  Medications Ordered in UC Medications - No data to display  Initial Impression / Assessment and Plan / UC Course  I have reviewed the triage vital signs and the nursing notes.  Pertinent labs & imaging results that were available during my care of the patient were reviewed by me and considered in my medical decision making (see chart for details).     Reviewed exam and symptoms with patient.  No red flags on exam. UA does not indicate UTI but will culture Negative hCG. Patient requesting blood work for hCG testing Vaginal swab and will contact for any positive results.  Will treat BV with Flagyl 500 mg twice daily for 7 days and Diflucan 150 mg tablets x 2 Mupirocin topical ointment to finger infection 3 times daily for 7 days.  Wound care reviewed Patient is up-to-date on her tetanus Rest and fluids Follow-up with PCP 2 to 3 days for recheck ER precautions reviewed and patient verbalized understanding Final Clinical Impressions(s) / UC Diagnoses   Final diagnoses:  Vaginal discharge  Acute vaginitis  Abrasion of finger, initial encounter  Missed menses     Discharge Instructions      Metronidazole twice daily for 7 days.  Please do not drink alcohol while on this medication Diflucan as prescribed Keep your start topical antibiotic  ointment 3 times a day to the finger wound.  Keep clean and dry The clinic will contact you with results of your urine culture or vaginal swab are positive and indicate any additional testing Rest and fluids Follow-up with your PCP in 2 days for recheck Please go to the emergency room if you have any worsening symptoms     ED Prescriptions     Medication Sig Dispense Auth. Provider   metroNIDAZOLE (FLAGYL) 500 MG tablet Take 1 tablet (500 mg total) by mouth 2 (two) times daily for 7 days. 14 tablet Melynda Ripple, NP   fluconazole (DIFLUCAN) 150 MG tablet Take 1 tablet (150 mg total) by mouth daily. Take 1 by mouth once and then may repeat in 3 days if symptoms persist 2 tablet Melynda Ripple, NP   mupirocin cream (BACTROBAN) 2 % Apply 1 Application topically 3 (three) times daily for 7 days. 15 g Melynda Ripple, NP      PDMP not reviewed this encounter.   Melynda Ripple, NP 06/30/22 0849    Melynda Ripple, NP 06/30/22 [phone]

## 2022-07-01 ENCOUNTER — Telehealth (HOSPITAL_COMMUNITY): Payer: Self-pay | Admitting: Emergency Medicine

## 2022-07-01 LAB — CERVICOVAGINAL ANCILLARY ONLY
Bacterial Vaginitis (gardnerella): POSITIVE — AB
Candida Glabrata: NEGATIVE
Candida Vaginitis: NEGATIVE
Chlamydia: NEGATIVE
Comment: NEGATIVE
Comment: NEGATIVE
Comment: NEGATIVE
Comment: NEGATIVE
Comment: NEGATIVE
Comment: NORMAL
Neisseria Gonorrhea: NEGATIVE
Trichomonas: POSITIVE — AB

## 2022-07-01 LAB — BETA HCG QUANT (REF LAB): hCG Quant: <1 m[IU]/mL

## 2022-07-02 ENCOUNTER — Telehealth (HOSPITAL_COMMUNITY): Payer: Self-pay | Admitting: Emergency Medicine

## 2022-07-02 LAB — URINE CULTURE

## 2022-07-02 NOTE — Telephone Encounter (Signed)
Opened in error

## 2022-08-18 ENCOUNTER — Ambulatory Visit
Admission: EM | Admit: 2022-08-18 | Discharge: 2022-08-18 | Disposition: A | Discharge status: Home / Self Care | Payer: Medicaid Other | Attending: Urgent Care | Admitting: Urgent Care

## 2022-08-18 DIAGNOSIS — J020 Streptococcal pharyngitis: Secondary | ICD-10-CM | POA: Diagnosis present

## 2022-08-18 DIAGNOSIS — N898 Other specified noninflammatory disorders of vagina: Secondary | ICD-10-CM

## 2022-08-18 DIAGNOSIS — R070 Pain in throat: Secondary | ICD-10-CM | POA: Diagnosis not present

## 2022-08-18 DIAGNOSIS — N760 Acute vaginitis: Secondary | ICD-10-CM | POA: Diagnosis not present

## 2022-08-18 DIAGNOSIS — Z113 Encounter for screening for infections with a predominantly sexual mode of transmission: Secondary | ICD-10-CM | POA: Diagnosis present

## 2022-08-18 DIAGNOSIS — B9689 Other specified bacterial agents as the cause of diseases classified elsewhere: Secondary | ICD-10-CM | POA: Diagnosis not present

## 2022-08-18 LAB — POCT RAPID STREP A (OFFICE): Rapid Strep A Screen: POSITIVE — AB

## 2022-08-18 LAB — POCT URINE PREGNANCY: Preg Test, Ur: NEGATIVE

## 2022-08-18 MED ORDER — FLUCONAZOLE 150 MG PO TABS: 150.0000 mg | ORAL_TABLET | ORAL | 0 refills | Status: DC

## 2022-08-18 MED ORDER — CLINDAMYCIN HCL 300 MG PO CAPS: 300.0000 mg | ORAL_CAPSULE | Freq: Three times a day (TID) | ORAL | 0 refills | Status: DC

## 2022-08-18 MED ORDER — METRONIDAZOLE 500 MG PO TABS: 500.0000 mg | ORAL_TABLET | Freq: Two times a day (BID) | ORAL | 0 refills | Status: DC

## 2022-08-18 NOTE — ED Provider Notes (Signed)
[facility]  Note:  This document was prepared using Systems analyst and may include unintentional dictation errors.  MRN: [id number] DOB: 11/24/1990  Subjective:   Krista Meza is a 32 y.o. female presenting for multiple complaints.  Has a history of BV and yeast infections and would like to be covered for this.  Has had thick vaginal discharge, vaginal itching.  Would like a complete STI check including blood work.  She would also like a pregnancy test.  LMP was 2 weeks ago. Has 2-day history of persistent throat pain, painful swallowing.  No cough, chest pain, shortness of breath or wheezing.  No current facility-administered medications for this encounter.  Current Outpatient Medications:    fluconazole (DIFLUCAN) 150 MG tablet, Take 1 tablet (150 mg total) by mouth daily. Take 1 by mouth once and then may repeat in 3 days if symptoms persist, Disp: 2 tablet, Rfl: 0   gabapentin (NEURONTIN) 100 MG capsule, , Disp: , Rfl:    predniSONE (DELTASONE) 20 MG tablet, , Disp: , Rfl:    valACYclovir (VALTREX) 1000 MG tablet, Take 1,000 mg by mouth 2 (two) times daily as needed (For outbreaks)., Disp: , Rfl:    Allergies  Allergen Reactions   Amoxicillin Hives    Has patient had a PCN reaction causing immediate rash, facial/tongue/throat swelling, SOB or lightheadedness with hypotension: Yes Has patient had a PCN reaction causing severe rash involving mucus membranes or skin necrosis: No Has patient had a PCN reaction that required hospitalization: No Has patient had a PCN reaction occurring within the last 10 years: No If all of the above answers are "NO", then may proceed with Cephalosporin use.    Penicillins Hives    Has patient had a PCN reaction causing immediate rash, facial/tongue/throat swelling, SOB or lightheadedness with hypotension: Yes Has patient had a PCN reaction causing severe rash involving mucus membranes or skin  necrosis: No Has patient had a PCN reaction that required hospitalization: No Has patient had a PCN reaction occurring within the last 10 years: No If all of the above answers are "NO", then may proceed with Cephalosporin use.     Past Medical History:  Diagnosis Date   Anxiety    Asthma    as child, inhaler for a year   Condyloma acuminata 10/14/2013   Vaginal white plaque approx 2.5 cm at base of introitus, smooth irregular borders. No pruritis. Biopsy postpartum.    Depression    doing ok   Headache    Heart murmur    palpitations   Heart palpitations    HSV (herpes simplex virus) anogenital infection    Urinary tract infection    Vaginal Pap smear, abnormal    Leep Jan 2019     Past Surgical History:  Procedure Laterality Date   arm surgery Right    CESAREAN SECTION  04/16/2011   Procedure: CESAREAN SECTION;  Surgeon: Agnes Lawrence, MD;  Location: [facility];  Service: Gynecology;  Laterality: N/A;   KNEE SURGERY Right 09/2020   LEEP     SUBMANDIBULAR GLAND EXCISION Right 09/2020   WISDOM TOOTH EXTRACTION      Family History  Problem Relation Age of Onset   Hypertension Mother    Cancer Father        thyroid   Diabetes Father    Hypertension Maternal Grandmother    Arthritis Maternal Grandmother    Heart disease Paternal Grandmother    Arthritis Paternal  Grandmother    Anesthesia problems Neg Hx    Hypotension Neg Hx    Malignant hyperthermia Neg Hx    Pseudochol deficiency Neg Hx     Social History   Tobacco Use   Smoking status: Former    Types: Cigars    Quit date: 04/08/2013    Years since quitting: 9.3   Smokeless tobacco: Never  Vaping Use   Vaping Use: Never used  Substance Use Topics   Alcohol use: No   Drug use: No    ROS   Objective:   Vitals: BP 126/77 (BP Location: Right Arm)   Pulse 90   Temp 98.5 F (36.9 C) (Oral)   Resp 16   LMP 08/06/2022 (Exact Date)   SpO2 98%   Physical Exam Constitutional:      General: She  is not in acute distress.    Appearance: Normal appearance. She is well-developed. She is not ill-appearing, toxic-appearing or diaphoretic.  HENT:     Head: Normocephalic and atraumatic.     Nose: Nose normal.     Mouth/Throat:     Mouth: Mucous membranes are moist.     Pharynx: No pharyngeal swelling, oropharyngeal exudate, posterior oropharyngeal erythema or uvula swelling.     Tonsils: No tonsillar exudate or tonsillar abscesses. 0 on the right. 0 on the left.  Eyes:     General: No scleral icterus.       Right eye: No discharge.        Left eye: No discharge.     Extraocular Movements: Extraocular movements intact.  Cardiovascular:     Rate and Rhythm: Normal rate.  Pulmonary:     Effort: Pulmonary effort is normal.  Skin:    General: Skin is warm and dry.  Neurological:     General: No focal deficit present.     Mental Status: She is alert and oriented to person, place, and time.  Psychiatric:        Mood and Affect: Mood normal.        Behavior: Behavior normal.    Results for orders placed or performed during the hospital encounter of 08/18/22 (from the past 24 hour(s))  POCT rapid strep A     Status: Abnormal   Collection Time: 08/18/22  7:59 PM  Result Value Ref Range   Rapid Strep A Screen Positive (A) Negative  POCT urine pregnancy     Status: None   Collection Time: 08/18/22  8:05 PM  Result Value Ref Range   Preg Test, Ur Negative Negative    Assessment and Plan :   PDMP not reviewed this encounter.  1. Strep pharyngitis   2. Vaginal discharge   3. Screen for STD (sexually transmitted disease)   4. Bacterial vaginosis   5. Acute vaginitis   6. Throat pain     Will treat for strep pharyngitis.  Patient is to start clindamycin, use supportive care otherwise. We will treat patient empirically for bacterial vaginosis with Flagyl and for yeast vaginitis with fluconazole.  Labs pending. Counseled patient on potential for adverse effects with medications  prescribed/recommended today, ER and return-to-clinic precautions discussed, patient verbalized understanding.    Jaynee Eagles, PA-C 08/18/22 2011

## 2022-08-18 NOTE — ED Notes (Signed)
Pt requested blood work and swab for STD's.

## 2022-08-18 NOTE — ED Triage Notes (Signed)
Pt reports lump in throat x 2 days. Reports pain when swallows,.   Pt reports greens vaginal discharge and vaginal itching x 3-4 days.

## 2022-08-19 LAB — CERVICOVAGINAL ANCILLARY ONLY
Bacterial Vaginitis (gardnerella): POSITIVE — AB
Candida Glabrata: NEGATIVE
Candida Vaginitis: NEGATIVE
Chlamydia: NEGATIVE
Comment: NEGATIVE
Comment: NEGATIVE
Comment: NEGATIVE
Comment: NEGATIVE
Comment: NEGATIVE
Comment: NORMAL
Neisseria Gonorrhea: NEGATIVE
Trichomonas: NEGATIVE

## 2022-08-20 LAB — RPR: RPR Ser Ql: NONREACTIVE

## 2022-08-20 LAB — HIV ANTIBODY (ROUTINE TESTING W REFLEX): HIV Screen 4th Generation wRfx: NONREACTIVE

## 2022-09-28 ENCOUNTER — Ambulatory Visit
Admission: EM | Admit: 2022-09-28 | Discharge: 2022-09-28 | Disposition: A | Discharge status: Home / Self Care | Payer: Medicaid Other | Attending: Urgent Care | Admitting: Urgent Care

## 2022-09-28 DIAGNOSIS — B9689 Other specified bacterial agents as the cause of diseases classified elsewhere: Secondary | ICD-10-CM

## 2022-09-28 DIAGNOSIS — N898 Other specified noninflammatory disorders of vagina: Secondary | ICD-10-CM

## 2022-09-28 DIAGNOSIS — R300 Dysuria: Secondary | ICD-10-CM

## 2022-09-28 DIAGNOSIS — N760 Acute vaginitis: Secondary | ICD-10-CM | POA: Diagnosis not present

## 2022-09-28 DIAGNOSIS — Z113 Encounter for screening for infections with a predominantly sexual mode of transmission: Secondary | ICD-10-CM | POA: Diagnosis not present

## 2022-09-28 LAB — POCT URINALYSIS DIP (MANUAL ENTRY)
Bilirubin, UA: NEGATIVE
Blood, UA: NEGATIVE
Glucose, UA: NEGATIVE mg/dL
Ketones, POC UA: NEGATIVE mg/dL
Nitrite, UA: NEGATIVE
Protein Ur, POC: NEGATIVE mg/dL
Spec Grav, UA: 1.025 (ref 1.010–1.025)
Urobilinogen, UA: 0.2 E.U./dL
pH, UA: 7 (ref 5.0–8.0)

## 2022-09-28 LAB — POCT URINE PREGNANCY: Preg Test, Ur: NEGATIVE

## 2022-09-28 MED ORDER — CLINDAMYCIN HCL 300 MG PO CAPS: 300.0000 mg | ORAL_CAPSULE | Freq: Two times a day (BID) | ORAL | 0 refills | Status: AC

## 2022-09-28 MED ORDER — FLUCONAZOLE 150 MG PO TABS: 150.0000 mg | ORAL_TABLET | ORAL | 0 refills | Status: AC

## 2022-09-28 NOTE — Discharge Instructions (Addendum)
Please start clindamycin and fluconazole to address a recurring BV and yeast infection. Make sure you hydrate very well with plain water and a quantity of 80 ounces of water a day.  Please limit drinks that are considered urinary irritants such as soda, sweet tea, coffee, energy drinks, alcohol.  These can worsen your urinary and genital symptoms but also be the source of them.  I will let you know about your vaginal swab and urine culture results through MyChart to see if we need to prescribe or change your antibiotics based off of those results.

## 2022-09-28 NOTE — ED Triage Notes (Signed)
Pt requesting STD screening-c/o dysuria x 5 days-vaginal d/c started yesterday-NAD-steady gait

## 2022-09-28 NOTE — ED Provider Notes (Signed)
[facility]  Note:  This document was prepared using Conservation officer, historic buildings and may include unintentional dictation errors.  MRN: [id number] DOB: January 08, 1991  Subjective:   Krista Meza is a 32 y.o. female presenting for 5-day history of recurrent vaginal discharge, has to change her liners regularly, 5-day history of dysuria.  Patient has a history of recurrent BV and yeast infection.  She would like to be checked for UTI, pregnancy, had a vaginal cytology and blood work done for HIV and syphilis.  Patient does not hydrate consistently with water.  She drinks Dr. Reino Kent regularly and also drinks alcohol multiple times a week.  At her last office visit, she did end up filling the metronidazole but not the clindamycin.  States that her throat pain symptoms resolved after a couple of days and has not had a recurrence.  She did test positive for strep.  No current facility-administered medications for this encounter.  Current Outpatient Medications:    clindamycin (CLEOCIN) 300 MG capsule, Take 1 capsule (300 mg total) by mouth 3 (three) times daily., Disp: 30 capsule, Rfl: 0   fluconazole (DIFLUCAN) 150 MG tablet, Take 1 tablet (150 mg total) by mouth once a week., Disp: 2 tablet, Rfl: 0   gabapentin (NEURONTIN) 100 MG capsule, , Disp: , Rfl:    metroNIDAZOLE (FLAGYL) 500 MG tablet, Take 1 tablet (500 mg total) by mouth 2 (two) times daily with a meal. DO NOT CONSUME ALCOHOL WHILE TAKING THIS MEDICATION., Disp: 14 tablet, Rfl: 0   predniSONE (DELTASONE) 20 MG tablet, , Disp: , Rfl:    valACYclovir (VALTREX) 1000 MG tablet, Take 1,000 mg by mouth 2 (two) times daily as needed (For outbreaks)., Disp: , Rfl:    Allergies  Allergen Reactions   Amoxicillin Hives    Has patient had a PCN reaction causing immediate rash, facial/tongue/throat swelling, SOB or lightheadedness with hypotension: Yes Has patient had a PCN reaction causing severe rash involving  mucus membranes or skin necrosis: No Has patient had a PCN reaction that required hospitalization: No Has patient had a PCN reaction occurring within the last 10 years: No If all of the above answers are "NO", then may proceed with Cephalosporin use.    Penicillins Hives    Has patient had a PCN reaction causing immediate rash, facial/tongue/throat swelling, SOB or lightheadedness with hypotension: Yes Has patient had a PCN reaction causing severe rash involving mucus membranes or skin necrosis: No Has patient had a PCN reaction that required hospitalization: No Has patient had a PCN reaction occurring within the last 10 years: No If all of the above answers are "NO", then may proceed with Cephalosporin use.     Past Medical History:  Diagnosis Date   Anxiety    Asthma    as child, inhaler for a year   Condyloma acuminata 10/14/2013   Vaginal white plaque approx 2.5 cm at base of introitus, smooth irregular borders. No pruritis. Biopsy postpartum.    Depression    doing ok   Headache    Heart murmur    palpitations   Heart palpitations    HSV (herpes simplex virus) anogenital infection    Urinary tract infection    Vaginal Pap smear, abnormal    Leep Jan 2019     Past Surgical History:  Procedure Laterality Date   arm surgery Right    CESAREAN SECTION  04/16/2011   Procedure: CESAREAN SECTION;  Surgeon: Roseanna Rainbow, MD;  Location: [facility];  Service: Gynecology;  Laterality: N/A;   KNEE SURGERY Right 09/2020   LEEP     SUBMANDIBULAR GLAND EXCISION Right 09/2020   WISDOM TOOTH EXTRACTION      Family History  Problem Relation Age of Onset   Hypertension Mother    Cancer Father        thyroid   Diabetes Father    Hypertension Maternal Grandmother    Arthritis Maternal Grandmother    Heart disease Paternal Grandmother    Arthritis Paternal Grandmother    Anesthesia problems Neg Hx    Hypotension Neg Hx    Malignant hyperthermia Neg Hx    Pseudochol  deficiency Neg Hx     Social History   Tobacco Use   Smoking status: Former    Types: Cigars    Quit date: 04/08/2013    Years since quitting: 9.4   Smokeless tobacco: Never  Vaping Use   Vaping Use: Never used  Substance Use Topics   Alcohol use: No   Drug use: No    ROS   Objective:   Vitals: BP 119/78 (BP Location: Right Arm)   Pulse 86   Temp 99.6 F (37.6 C) (Oral)   Resp 18   SpO2 98%   Physical Exam Constitutional:      General: She is not in acute distress.    Appearance: Normal appearance. She is well-developed. She is not ill-appearing, toxic-appearing or diaphoretic.  HENT:     Head: Normocephalic and atraumatic.     Nose: Nose normal.     Mouth/Throat:     Mouth: Mucous membranes are moist.     Pharynx: No oropharyngeal exudate or posterior oropharyngeal erythema.  Eyes:     General: No scleral icterus.       Right eye: No discharge.        Left eye: No discharge.     Extraocular Movements: Extraocular movements intact.     Conjunctiva/sclera: Conjunctivae normal.  Cardiovascular:     Rate and Rhythm: Normal rate.  Pulmonary:     Effort: Pulmonary effort is normal.  Abdominal:     General: Bowel sounds are normal. There is no distension.     Palpations: Abdomen is soft. There is no mass.     Tenderness: There is no abdominal tenderness. There is no right CVA tenderness, left CVA tenderness, guarding or rebound.  Skin:    General: Skin is warm and dry.  Neurological:     General: No focal deficit present.     Mental Status: She is alert and oriented to person, place, and time.  Psychiatric:        Mood and Affect: Mood normal.        Behavior: Behavior normal.        Thought Content: Thought content normal.        Judgment: Judgment normal.    Results for orders placed or performed during the hospital encounter of 09/28/22 (from the past 24 hour(s))  POCT urinalysis dipstick     Status: Abnormal   Collection Time: 09/28/22  3:47 PM   Result Value Ref Range   Color, UA yellow yellow   Clarity, UA clear clear   Glucose, UA negative negative mg/dL   Bilirubin, UA negative negative   Ketones, POC UA negative negative mg/dL   Spec Grav, UA 4.540 9.811 - 1.025   Blood, UA negative negative   pH, UA 7.0 5.0 - 8.0   Protein Ur, POC negative  negative mg/dL   Urobilinogen, UA 0.2 0.2 or 1.0 E.U./dL   Nitrite, UA Negative Negative   Leukocytes, UA Small (1+) (A) Negative  POCT urine pregnancy     Status: None   Collection Time: 09/28/22  3:48 PM  Result Value Ref Range   Preg Test, Ur Negative Negative    Assessment and Plan :   PDMP not reviewed this encounter.  1. Bacterial vaginosis   2. Dysuria   3. Screen for STD (sexually transmitted disease)   4. Vaginal discharge    We will treat patient empirically for bacterial vaginosis with clindamycin and for yeast vaginitis with fluconazole.  Labs pending.  Patient requested clindamycin as she has not yet taken this in would like to avoid antibiotics that have not worked for her BV infections.  I did emphasize need to avoid urinary irritants and to hydrate with plain water, will treat as appropriate based off of results otherwise.  Counseled patient on potential for adverse effects with medications prescribed/recommended today, ER and return-to-clinic precautions discussed, patient verbalized understanding.    Wallis Bamberg, [place] 09/28/22 1605

## 2022-09-29 LAB — CERVICOVAGINAL ANCILLARY ONLY
Bacterial Vaginitis (gardnerella): POSITIVE — AB
Candida Glabrata: NEGATIVE
Candida Vaginitis: POSITIVE — AB
Chlamydia: NEGATIVE
Comment: NEGATIVE
Comment: NEGATIVE
Comment: NEGATIVE
Comment: NEGATIVE
Comment: NEGATIVE
Comment: NORMAL
Neisseria Gonorrhea: NEGATIVE
Trichomonas: NEGATIVE

## 2022-09-29 LAB — RPR: RPR Ser Ql: NONREACTIVE

## 2022-09-29 LAB — HIV ANTIBODY (ROUTINE TESTING W REFLEX): HIV Screen 4th Generation wRfx: NONREACTIVE

## 2022-09-30 LAB — URINE CULTURE

## 2022-10-14 ENCOUNTER — Ambulatory Visit: Payer: Medicaid Other | Admitting: Obstetrics & Gynecology

## 2022-10-28 ENCOUNTER — Emergency Department (HOSPITAL_BASED_OUTPATIENT_CLINIC_OR_DEPARTMENT_OTHER)
Admission: EM | Admit: 2022-10-28 | Discharge: 2022-10-28 | Disposition: A | Discharge status: Home / Self Care | Payer: BLUE CROSS/BLUE SHIELD | Attending: Emergency Medicine | Admitting: Emergency Medicine

## 2022-10-28 ENCOUNTER — Encounter (HOSPITAL_BASED_OUTPATIENT_CLINIC_OR_DEPARTMENT_OTHER): Payer: Self-pay | Admitting: Emergency Medicine

## 2022-10-28 ENCOUNTER — Other Ambulatory Visit: Payer: Self-pay

## 2022-10-28 DIAGNOSIS — T310 Burns involving less than 10% of body surface: Secondary | ICD-10-CM | POA: Diagnosis not present

## 2022-10-28 DIAGNOSIS — T304 Corrosion of unspecified body region, unspecified degree: Secondary | ICD-10-CM

## 2022-10-28 MED ORDER — FAMOTIDINE 20 MG PO TABS: 20.0000 mg | ORAL_TABLET | Freq: Two times a day (BID) | ORAL | 0 refills | Status: AC

## 2022-10-28 MED ORDER — PREDNISONE 50 MG PO TABS
60.0000 mg | ORAL_TABLET | Freq: Once | ORAL | Status: AC
  Administered 2022-10-28: 60 mg via ORAL
  Filled 2022-10-28: qty 1

## 2022-10-28 MED ORDER — PREDNISONE 20 MG PO TABS: 20.0000 mg | ORAL_TABLET | Freq: Every day | ORAL | 0 refills | Status: AC
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
